# Patient Record
Sex: Female | Born: 1935 | Race: Black or African American | Hispanic: No | Marital: Single | Smoking: Current every day smoker
Health system: Southern US, Community
[De-identification: ages and names within clinical notes are randomized; demographics above are authoritative.]

## PROBLEM LIST (undated history)

## (undated) DIAGNOSIS — E119 Type 2 diabetes mellitus without complications: Secondary | ICD-10-CM

## (undated) DIAGNOSIS — F0390 Unspecified dementia without behavioral disturbance: Secondary | ICD-10-CM

## (undated) DIAGNOSIS — I10 Essential (primary) hypertension: Secondary | ICD-10-CM

## (undated) DIAGNOSIS — M1990 Unspecified osteoarthritis, unspecified site: Secondary | ICD-10-CM

## (undated) HISTORY — PX: CARDIAC SURGERY: SHX584

## (undated) HISTORY — PX: ABDOMINAL HYSTERECTOMY: SHX81

---

## 2017-07-05 ENCOUNTER — Emergency Department (HOSPITAL_COMMUNITY): Payer: Medicare Other

## 2017-07-05 ENCOUNTER — Encounter (HOSPITAL_COMMUNITY): Payer: Self-pay | Admitting: Emergency Medicine

## 2017-07-05 ENCOUNTER — Observation Stay (HOSPITAL_COMMUNITY)
Admission: EM | Admit: 2017-07-05 | Discharge: 2017-07-08 | Disposition: A | Discharge status: Home / Self Care | Payer: Medicare Other | Attending: Internal Medicine | Admitting: Internal Medicine

## 2017-07-05 DIAGNOSIS — Z90710 Acquired absence of both cervix and uterus: Secondary | ICD-10-CM | POA: Diagnosis not present

## 2017-07-05 DIAGNOSIS — I447 Left bundle-branch block, unspecified: Secondary | ICD-10-CM | POA: Diagnosis not present

## 2017-07-05 DIAGNOSIS — Z7982 Long term (current) use of aspirin: Secondary | ICD-10-CM | POA: Diagnosis not present

## 2017-07-05 DIAGNOSIS — Z955 Presence of coronary angioplasty implant and graft: Secondary | ICD-10-CM | POA: Insufficient documentation

## 2017-07-05 DIAGNOSIS — F17210 Nicotine dependence, cigarettes, uncomplicated: Secondary | ICD-10-CM | POA: Diagnosis not present

## 2017-07-05 DIAGNOSIS — E785 Hyperlipidemia, unspecified: Secondary | ICD-10-CM | POA: Diagnosis not present

## 2017-07-05 DIAGNOSIS — F0390 Unspecified dementia without behavioral disturbance: Secondary | ICD-10-CM | POA: Diagnosis not present

## 2017-07-05 DIAGNOSIS — N179 Acute kidney failure, unspecified: Secondary | ICD-10-CM | POA: Diagnosis not present

## 2017-07-05 DIAGNOSIS — R197 Diarrhea, unspecified: Secondary | ICD-10-CM | POA: Diagnosis not present

## 2017-07-05 DIAGNOSIS — W010XXA Fall on same level from slipping, tripping and stumbling without subsequent striking against object, initial encounter: Secondary | ICD-10-CM | POA: Insufficient documentation

## 2017-07-05 DIAGNOSIS — Z79899 Other long term (current) drug therapy: Secondary | ICD-10-CM | POA: Insufficient documentation

## 2017-07-05 DIAGNOSIS — I129 Hypertensive chronic kidney disease with stage 1 through stage 4 chronic kidney disease, or unspecified chronic kidney disease: Secondary | ICD-10-CM | POA: Insufficient documentation

## 2017-07-05 DIAGNOSIS — N183 Chronic kidney disease, stage 3 (moderate): Secondary | ICD-10-CM | POA: Diagnosis not present

## 2017-07-05 DIAGNOSIS — I10 Essential (primary) hypertension: Secondary | ICD-10-CM | POA: Diagnosis present

## 2017-07-05 DIAGNOSIS — E119 Type 2 diabetes mellitus without complications: Secondary | ICD-10-CM

## 2017-07-05 DIAGNOSIS — E1122 Type 2 diabetes mellitus with diabetic chronic kidney disease: Secondary | ICD-10-CM | POA: Diagnosis not present

## 2017-07-05 DIAGNOSIS — R079 Chest pain, unspecified: Secondary | ICD-10-CM | POA: Diagnosis present

## 2017-07-05 DIAGNOSIS — R0789 Other chest pain: Secondary | ICD-10-CM | POA: Diagnosis present

## 2017-07-05 DIAGNOSIS — I2510 Atherosclerotic heart disease of native coronary artery without angina pectoris: Secondary | ICD-10-CM | POA: Insufficient documentation

## 2017-07-05 DIAGNOSIS — I700 Atherosclerosis of aorta: Secondary | ICD-10-CM | POA: Insufficient documentation

## 2017-07-05 HISTORY — DX: Unspecified dementia, unspecified severity, without behavioral disturbance, psychotic disturbance, mood disturbance, and anxiety: F03.90

## 2017-07-05 HISTORY — DX: Essential (primary) hypertension: I10

## 2017-07-05 HISTORY — DX: Unspecified osteoarthritis, unspecified site: M19.90

## 2017-07-05 HISTORY — DX: Type 2 diabetes mellitus without complications: E11.9

## 2017-07-05 LAB — BASIC METABOLIC PANEL
Anion gap: 8 (ref 5–15)
BUN: 23 mg/dL — ABNORMAL HIGH (ref 6–20)
CHLORIDE: 109 mmol/L (ref 101–111)
CO2: 28 mmol/L (ref 22–32)
CREATININE: 1.49 mg/dL — AB (ref 0.44–1.00)
Calcium: 8.9 mg/dL (ref 8.9–10.3)
GFR calc Af Amer: 36 mL/min — ABNORMAL LOW (ref >60)
GFR calc non Af Amer: 31 mL/min — ABNORMAL LOW (ref >60)
Glucose, Bld: 108 mg/dL — ABNORMAL HIGH (ref 65–99)
POTASSIUM: 4.3 mmol/L (ref 3.5–5.1)
SODIUM: 145 mmol/L (ref 135–145)

## 2017-07-05 LAB — CBC
HEMATOCRIT: 38.2 % (ref 36.0–46.0)
Hemoglobin: 12 g/dL (ref 12.0–15.0)
MCH: 29.1 pg (ref 26.0–34.0)
MCHC: 31.4 g/dL (ref 30.0–36.0)
MCV: 92.7 fL (ref 78.0–100.0)
Platelets: 267 10*3/uL (ref 150–400)
RBC: 4.12 MIL/uL (ref 3.87–5.11)
RDW: 15.4 % (ref 11.5–15.5)
WBC: 8.8 10*3/uL (ref 4.0–10.5)

## 2017-07-05 LAB — I-STAT TROPONIN, ED: Troponin i, poc: 0.02 ng/mL (ref 0.00–0.08)

## 2017-07-05 MED ORDER — ASPIRIN 325 MG PO TABS
325.0000 mg | ORAL_TABLET | Freq: Once | ORAL | Status: AC
  Administered 2017-07-05: 325 mg via ORAL
  Filled 2017-07-05: qty 1

## 2017-07-05 NOTE — ED Notes (Signed)
Pt reports wanting to go home and not having any chest pains, granddaughter states that pt was recently hospitalized 3 weeks ago for identical symptoms.

## 2017-07-05 NOTE — ED Triage Notes (Signed)
Pt reports falling yesterday. Pt also c/o left sided chest pains that radiates to left arm that started today intermittently.  Pt has dementia.

## 2017-07-05 NOTE — ED Notes (Signed)
ED Provider at bedside. 

## 2017-07-05 NOTE — ED Provider Notes (Signed)
[facility]-EMERGENCY DEPT Provider Note   CSN: [id number] Arrival date & time: 07/05/17  1726     History   Chief Complaint Chief Complaint  Patient presents with  . Chest Pain  . Fall    HPI Joann Eliot FordBriggs is a 84 y.o. female.  HPI Patient presents to the emergency department with mid chest pain that radiated down the left arm intermittently today.  The patient does have a cardiac history.  Patient does have dementia and can give some history but the granddaughter states that is not totally accurate.  Patient did not take any medications prior to arrival.  Not able to give timing of the chest discomfort.  Patient states she did have some shortness of breath as well.  The patient denies headache,blurred vision, neck pain, fever, cough, weakness, numbness, dizziness, anorexia, edema, abdominal pain, nausea, vomiting, diarrhea, rash, back pain, dysuria, hematemesis, bloody stool, near syncope, or syncope. Past Medical History:  Diagnosis Date  . Arthritis   . Dementia   . Diabetes mellitus without complication (HCC)   . Hypertension     There are no active problems to display for this patient.   Past Surgical History:  Procedure Laterality Date  . ABDOMINAL HYSTERECTOMY    . CARDIAC SURGERY       OB History   None      Home Medications    Prior to Admission medications   Medication Sig Start Date End Date Taking? Authorizing Provider  aspirin EC 81 MG tablet Take 81 mg by mouth daily.   Yes [provider]  lisinopril-hydrochlorothiazide (PRINZIDE,ZESTORETIC) 10-12.5 MG tablet Take 1 tablet by mouth daily.   Yes [provider]  MELATONIN PO Take 1 tablet by mouth at bedtime as needed (sleep).   Yes [provider]  rivastigmine (EXELON) 4.6 mg/24hr Place 4.6 mg onto the skin daily.    Yes [provider]  simvastatin (ZOCOR) 40 MG tablet Take 40 mg by mouth daily.   Yes [provider]    Family  History No family history on file.  Social History Social History   Tobacco Use  . Smoking status: Current Every Day Smoker    Types: Cigarettes  . Smokeless tobacco: Never Used  Substance Use Topics  . Alcohol use: Not Currently  . Drug use: Not on file     Allergies   Patient has no known allergies.   Review of Systems Review of Systems All other systems negative except as documented in the HPI. All pertinent positives and negatives as reviewed in the HPI.  Physical Exam Updated Vital Signs BP (!) 166/91   Pulse 70   Temp 98.3 F (36.8 C) (Oral)   Resp 13   Ht 5\' 7"  (1.702 m)   Wt 72.6 kg (160 lb)   SpO2 100%   BMI 25.06 kg/m   Physical Exam  Constitutional: She is oriented to person, place, and time. She appears well-developed and well-nourished. No distress.  HENT:  Head: Normocephalic and atraumatic.  Mouth/Throat: Oropharynx is clear and moist.  Eyes: Pupils are equal, round, and reactive to light.  Neck: Normal range of motion. Neck supple.  Cardiovascular: Normal rate, regular rhythm, normal heart sounds and normal pulses. Exam reveals no gallop and no friction rub.  No murmur heard. Pulmonary/Chest: Effort normal and breath sounds normal. No respiratory distress. She has no decreased breath sounds. She has no wheezes. She has no rhonchi. She has no rales.  Abdominal: Soft. Bowel  sounds are normal. She exhibits no distension. There is no tenderness.  Neurological: She is alert and oriented to person, place, and time. She exhibits normal muscle tone. Coordination normal.  Skin: Skin is warm and dry. Capillary refill takes less than 2 seconds. No rash noted. No erythema.  Psychiatric: She has a normal mood and affect. Her behavior is normal.  Nursing note and vitals reviewed.    ED Treatments / Results  Labs (all labs ordered are listed, but only abnormal results are displayed) Labs Reviewed  BASIC METABOLIC PANEL - Abnormal; Notable for the following  components:      Result Value   Glucose, Bld 108 (*)    BUN 23 (*)    Creatinine, Ser 1.49 (*)    GFR calc non Af Amer 31 (*)    GFR calc Af Amer 36 (*)    All other components within normal limits  CBC  I-STAT TROPONIN, ED    EKG EKG Interpretation  Date/Time:  Friday Billye 21 2019 17:42:22 EDT Ventricular Rate:  83 PR Interval:    QRS Duration: 109 QT Interval:  386 QTC Calculation: 454 R Axis:   -20 Text Interpretation:  Sinus rhythm Incomplete left bundle branch block LVH with secondary repolarization abnormality Anterior Q waves, possibly due to LVH no prior available for comparison Confirmed by Tilden Fossa [phone]) on 07/05/2017 8:10:13 PM   Radiology Dg Chest 2 View  Result Date: 07/05/2017 CLINICAL DATA:  Chest pain EXAM: CHEST - 2 VIEW COMPARISON:  None. FINDINGS: No acute opacity or pleural effusion. Linear scar atelectasis in the left lower lung. Heart size within normal limits. Tortuous aorta with atherosclerosis. No pneumothorax. IMPRESSION: No active cardiopulmonary disease. Aortic tortuosity. Small amount of linear atelectasis or scarring in the left lower lung. Electronically Signed   By: Jasmine Pang M.D.   On: 07/05/2017 18:13    Procedures Procedures (including critical care time)  Medications Ordered in ED Medications - No data to display   Initial Impression / Assessment and Plan / ED Course  I have reviewed the triage vital signs and the nursing notes.  Pertinent labs & imaging results that were available during my care of the patient were reviewed by me and considered in my medical decision making (see chart for details).     Patient does have an extensive cardiac history and will need admission for cardiac rule out.  The granddaughter felt more comfortable with this plan.  I will speak with the [facility] about admission.  Final Clinical Impressions(s) / ED Diagnoses   Final diagnoses:  None    ED Discharge Orders    None         Kyra Manges 07/05/17 2226    Tilden Fossa, MD 07/16/17 [phone]

## 2017-07-05 NOTE — ED Notes (Signed)
Pt's contact--- Patsy LagerYOLANDA (granddaughter): tel# [phone]

## 2017-07-06 ENCOUNTER — Other Ambulatory Visit: Payer: Self-pay

## 2017-07-06 ENCOUNTER — Other Ambulatory Visit (HOSPITAL_COMMUNITY): Payer: Medicare Other

## 2017-07-06 ENCOUNTER — Encounter (HOSPITAL_COMMUNITY): Payer: Self-pay

## 2017-07-06 DIAGNOSIS — E119 Type 2 diabetes mellitus without complications: Secondary | ICD-10-CM

## 2017-07-06 DIAGNOSIS — R0789 Other chest pain: Secondary | ICD-10-CM | POA: Diagnosis not present

## 2017-07-06 DIAGNOSIS — R079 Chest pain, unspecified: Secondary | ICD-10-CM | POA: Diagnosis not present

## 2017-07-06 DIAGNOSIS — F0390 Unspecified dementia without behavioral disturbance: Secondary | ICD-10-CM | POA: Diagnosis present

## 2017-07-06 DIAGNOSIS — I10 Essential (primary) hypertension: Secondary | ICD-10-CM | POA: Diagnosis present

## 2017-07-06 DIAGNOSIS — N183 Chronic kidney disease, stage 3 (moderate): Secondary | ICD-10-CM | POA: Diagnosis not present

## 2017-07-06 DIAGNOSIS — E1122 Type 2 diabetes mellitus with diabetic chronic kidney disease: Secondary | ICD-10-CM | POA: Diagnosis not present

## 2017-07-06 DIAGNOSIS — I129 Hypertensive chronic kidney disease with stage 1 through stage 4 chronic kidney disease, or unspecified chronic kidney disease: Secondary | ICD-10-CM | POA: Diagnosis not present

## 2017-07-06 LAB — TROPONIN I: Troponin I: <0.03 ng/mL (ref <0.03)

## 2017-07-06 MED ORDER — TRAZODONE HCL 50 MG PO TABS: 50.0000 mg | ORAL_TABLET | Freq: Every evening | ORAL | Status: DC | PRN

## 2017-07-06 MED ORDER — HEPARIN SODIUM (PORCINE) 5000 UNIT/ML IJ SOLN
5000.0000 [IU] | Freq: Three times a day (TID) | INTRAMUSCULAR | Status: DC
  Administered 2017-07-06 – 2017-07-08 (×6): 5000 [IU] via SUBCUTANEOUS
  Filled 2017-07-06 (×6): qty 1

## 2017-07-06 MED ORDER — ONDANSETRON HCL 4 MG/2ML IJ SOLN
4.0000 mg | Freq: Four times a day (QID) | INTRAMUSCULAR | Status: DC | PRN
Start: 2017-07-06 — End: 2017-07-08

## 2017-07-06 MED ORDER — ROSUVASTATIN CALCIUM 10 MG PO TABS: 10.0000 mg | ORAL_TABLET | Freq: Every day | ORAL | Status: DC

## 2017-07-06 MED ORDER — SIMVASTATIN 40 MG PO TABS
40.0000 mg | ORAL_TABLET | Freq: Every day | ORAL | Status: DC
  Administered 2017-07-06: 40 mg via ORAL
  Filled 2017-07-06: qty 1

## 2017-07-06 MED ORDER — ACETAMINOPHEN 650 MG RE SUPP: 650.0000 mg | Freq: Four times a day (QID) | RECTAL | Status: DC | PRN

## 2017-07-06 MED ORDER — AMLODIPINE BESYLATE 5 MG PO TABS
5.0000 mg | ORAL_TABLET | Freq: Every day | ORAL | Status: DC
  Administered 2017-07-06 – 2017-07-08 (×3): 5 mg via ORAL
  Filled 2017-07-06 (×3): qty 1

## 2017-07-06 MED ORDER — SODIUM CHLORIDE 0.45 % IV SOLN
INTRAVENOUS | Status: AC
  Administered 2017-07-06: 04:00:00 via INTRAVENOUS

## 2017-07-06 MED ORDER — ROSUVASTATIN CALCIUM 10 MG PO TABS
10.0000 mg | ORAL_TABLET | Freq: Every day | ORAL | Status: DC
Start: 2017-07-06 — End: 2017-07-06

## 2017-07-06 MED ORDER — ACETAMINOPHEN 325 MG PO TABS: 650.0000 mg | ORAL_TABLET | Freq: Four times a day (QID) | ORAL | Status: DC | PRN

## 2017-07-06 MED ORDER — NITROGLYCERIN 0.4 MG SL SUBL
0.4000 mg | SUBLINGUAL_TABLET | SUBLINGUAL | Status: DC | PRN
Start: 2017-07-06 — End: 2017-07-08

## 2017-07-06 MED ORDER — ONDANSETRON HCL 4 MG PO TABS: 4.0000 mg | ORAL_TABLET | Freq: Four times a day (QID) | ORAL | Status: DC | PRN

## 2017-07-06 MED ORDER — ASPIRIN EC 81 MG PO TBEC
81.0000 mg | DELAYED_RELEASE_TABLET | Freq: Every day | ORAL | Status: DC
  Administered 2017-07-06 – 2017-07-08 (×3): 81 mg via ORAL
  Filled 2017-07-06 (×3): qty 1

## 2017-07-06 MED ORDER — RIVASTIGMINE 4.6 MG/24HR TD PT24
4.6000 mg | MEDICATED_PATCH | Freq: Every day | TRANSDERMAL | Status: DC
  Administered 2017-07-06 – 2017-07-07 (×2): 4.6 mg via TRANSDERMAL
  Filled 2017-07-06 (×3): qty 1

## 2017-07-06 NOTE — Evaluation (Signed)
Physical Therapy Evaluation Patient Details Name: Joann Mcgee MRN: [id number] DOB: 1930/01/23 Today's Date: 07/06/2017   History of Present Illness  Pt admitted s/p fall from bed and with c/o chest pain.  Pt with hx of DM, dementia and "extensive cardiac history"  Clinical Impression  Pt admitted as above and presenting with functional mobility limitations 2* generalized weakness and mild ambulatory balance deficits.  Pt should progress to dc home with assist of family.    Follow Up Recommendations No PT follow up    Equipment Recommendations  None recommended by PT    Recommendations for Other Services       Precautions / Restrictions Precautions Precautions: Fall Restrictions Weight Bearing Restrictions: No      Mobility  Bed Mobility               General bed mobility comments: Pt up in chair and requests back to same  Transfers Overall transfer level: Modified independent Equipment used: 4-wheeled walker                Ambulation/Gait Ambulation/Gait assistance: Min Emergency planning/management officerguard;Supervision Gait Distance (Feet): 400 Feet Assistive device: 4-wheeled walker Gait Pattern/deviations: Step-through pattern;Shuffle;Trunk flexed Gait velocity: decr   General Gait Details: mild instability but no LOB; cues for posture and position from [employer]  Stairs            Wheelchair Mobility    Modified Rankin (Stroke Patients Only)       Balance Overall balance assessment: Needs assistance Sitting-balance support: No upper extremity supported;Feet supported Sitting balance-Leahy Scale: Good     Standing balance support: No upper extremity supported Standing balance-Leahy Scale: Good                               Pertinent Vitals/Pain Pain Assessment: No/denies pain    Home Living Family/patient expects to be discharged to:: Private residence Living Arrangements: Other relatives Available Help at Discharge: Family;Available 24 hours/day Type  of Home: House       Home Layout: One level Home Equipment: Walker - 4 wheels Additional Comments: Pt lives with granddaughter    Prior Function Level of Independence: Independent with assistive device(s)         Comments: Pt uses rollator     Hand Dominance        Extremity/Trunk Assessment   Upper Extremity Assessment Upper Extremity Assessment: Generalized weakness    Lower Extremity Assessment Lower Extremity Assessment: Generalized weakness    Cervical / Trunk Assessment Cervical / Trunk Assessment: Kyphotic  Communication   Communication: No difficulties  Cognition Arousal/Alertness: Awake/alert Behavior During Therapy: WFL for tasks assessed/performed;Impulsive Overall Cognitive Status: History of cognitive impairments - at baseline                                 General Comments: pt with hx of dementia      General Comments      Exercises     Assessment/Plan    PT Assessment Patient needs continued PT services  PT Problem List Decreased activity tolerance;Decreased balance;Decreased mobility;Decreased knowledge of use of DME;Decreased strength       PT Treatment Interventions DME instruction;Gait training;Functional mobility training;Therapeutic activities;Therapeutic exercise;Balance training;Patient/family education    PT Goals (Current goals can be found in the Care Plan section)  Acute Rehab PT Goals Patient Stated Goal: HOME PT Goal Formulation: With patient Time  For Goal Achievement: 07/20/17 Potential to Achieve Goals: Good    Frequency Min 3X/week   Barriers to discharge        Co-evaluation               AM-PAC PT "6 Clicks" Daily Activity  Outcome Measure Difficulty turning over in bed (including adjusting bedclothes, sheets and blankets)?: A Little Difficulty moving from lying on back to sitting on the side of the bed? : A Little Difficulty sitting down on and standing up from a chair with arms (e.g.,  wheelchair, bedside commode, etc,.)?: A Little Help needed moving to and from a bed to chair (including a wheelchair)?: A Little Help needed walking in hospital room?: A Little Help needed climbing 3-5 steps with a railing? : A Little 6 Click Score: 18    End of Session Equipment Utilized During Treatment: Gait belt Activity Tolerance: Patient tolerated treatment well Patient left: in chair;with call bell/phone within reach;with chair alarm set Nurse Communication: Mobility status PT Visit Diagnosis: Difficulty in walking, not elsewhere classified (R26.2)    Time: 1610-9604 PT Time Calculation (min) (ACUTE ONLY): 21 min   Charges:   PT Evaluation $PT Eval Low Complexity: 1 Low     PT G Codes:        Pg [phone]   Levora Werden 07/06/2017, 12:40 PM

## 2017-07-06 NOTE — Progress Notes (Signed)
84 year old female admitted early this morning chest pain atypical.  Patient has history of dementia, diabetes, hypertension.  History obtained from patient's granddaughter who patient lives with.  Patient had a fall 2 days ago which when she actually slipped down from her bed to the floor with no syncope no complaints of chest pain or shortness of breath at that time.  However she was admitted few weeks ago to the hospital with chest pain which I do not have the records.  I have reviewed her chart and her labs.  Cardiac enzymes are normal.  EKG is unremarkable.  Patient moved here from [place] last year.  Will order echocardiogram and consult cardiology per family request.  Patient might need a stress test prior to discharge.

## 2017-07-06 NOTE — H&P (Addendum)
History and Physical    Joann Mcgee ZOX:[id number] DOB: 06-03-1930 DOA: 07/05/2017  [facility]   Patient coming from: home  Chief Complaint: Chest Pain  HPI: Joann Mcgee is a 84 y.o. female with medical history significant for DM, HTN, dementia, presented to the ED with reports of chest pain.  Patient was brought in by her granddaughter-who patient resides with but is not present at bedside, got voicemail when I called her number.  Per chat-patient complained of intermittent, left-sided chest pain that radiates to her left arm, and similar occurrence 3 weeks ago requiring hospitalization.  Patient denies chest pain, or shortness of breath.  He cannot tell me any provoking or relieving factors. Reports a mild chronic cough ?  If productive.  Reports she fell 2 days ago, when she got out of bed, her legs slid out from underneath her, as the floor is slippery.  She has been ambulating since then, without pain.  She reports last time she had chest pain was 2 weeks ago.  Patient denies recent trip, denies swelling of her lower extremities, pain or redness.   ED Course: Blood pressure systolic 135- 164.  Heart rate 59 - 89.  Chest x-ray-no acute abnormality, small amount of linear atelectasis or scarring LLL.  WBC 8.8.  Hemoglobin stable 12.  Creatinine elevated 1.49.  I-stat troponin negative.  Hospitalist was called to admit for chest pain as she has a cardiac history.  Review of Systems: As per HPI otherwise 10 point review of systems negative.   Past Medical History:  Diagnosis Date  . Arthritis   . Dementia   . Diabetes mellitus without complication (HCC)   . Hypertension     Past Surgical History:  Procedure Laterality Date  . ABDOMINAL HYSTERECTOMY    . CARDIAC SURGERY       reports that she has been smoking cigarettes.  She has never used smokeless tobacco. She reports that she drank alcohol. Her drug history is not on file.  No Known  Allergies  Family history not contributory.  Prior to Admission medications   Medication Sig Start Date End Date Taking? Authorizing Provider  aspirin EC 81 MG tablet Take 81 mg by mouth daily.   Yes [provider]  lisinopril-hydrochlorothiazide (PRINZIDE,ZESTORETIC) 10-12.5 MG tablet Take 1 tablet by mouth daily.   Yes [provider]  MELATONIN PO Take 1 tablet by mouth at bedtime as needed (sleep).   Yes [provider]  rivastigmine (EXELON) 4.6 mg/24hr Place 4.6 mg onto the skin daily.    Yes [provider]  simvastatin (ZOCOR) 40 MG tablet Take 40 mg by mouth daily.   Yes [provider]    Physical Exam: Vitals:   07/05/17 2130 07/05/17 2200 07/05/17 2230 07/05/17 2331  BP: (!) 168/94 (!) 166/91 138/84 (!) 164/95  Pulse: 65 70 71 64  Resp: 17 13  17   Temp:      TempSrc:      SpO2: 100% 100% 100% 95%  Weight:      Height:        Constitutional: NAD, calm, comfortable Vitals:   07/05/17 2130 07/05/17 2200 07/05/17 2230 07/05/17 2331  BP: (!) 168/94 (!) 166/91 138/84 (!) 164/95  Pulse: 65 70 71 64  Resp: 17 13  17   Temp:      TempSrc:      SpO2: 100% 100% 100% 95%  Weight:      Height:  Eyes: PERRL, lids and conjunctivae normal ENMT: Mucous membranes are dry. Posterior pharynx clear of any exudate or lesions Neck: normal, supple, no masses, no thyromegaly Respiratory: clear to auscultation bilaterally, no wheezing, no crackles. Normal respiratory effort. No accessory muscle use.  Cardiovascular: Regular rate and rhythm, no murmurs / rubs / gallops. No extremity edema. 2+ pedal pulses. Abdomen: no tenderness, no masses palpated. No hepatosplenomegaly. Bowel sounds positive.  Musculoskeletal: no clubbing / cyanosis. No joint deformity upper and lower extremities. Good ROM, no contractures. Normal muscle tone.  Skin: no rashes, lesions, ulcers. No induration Neurologic: CN 2-12 grossly intact.  Strength 5/5 in all 4.    Psychiatric: Normal judgment and insight. Alert and oriented x 2- Person and place. Normal mood.   Labs on Admission: I have personally reviewed following labs and imaging studies  CBC: Recent Labs  Lab 07/05/17 1752  WBC 8.8  HGB 12.0  HCT 38.2  MCV 92.7  PLT 267   Basic Metabolic Panel: Recent Labs  Lab 07/05/17 1752  NA 145  K 4.3  CL 109  CO2 28  GLUCOSE 108*  BUN 23*  CREATININE 1.49*  CALCIUM 8.9    Radiological Exams on Admission: Dg Chest 2 View  Result Date: 07/05/2017 CLINICAL DATA:  Chest pain EXAM: CHEST - 2 VIEW COMPARISON:  None. FINDINGS: No acute opacity or pleural effusion. Linear scar atelectasis in the left lower lung. Heart size within normal limits. Tortuous aorta with atherosclerosis. No pneumothorax. IMPRESSION: No active cardiopulmonary disease. Aortic tortuosity. Small amount of linear atelectasis or scarring in the left lower lung. Electronically Signed   By: Jasmine PangKim  Fujinaga M.D.   On: 07/05/2017 18:13    EKG: Independently reviewed.  Incomplete LBBB, T wave inversions 1 aVL.  No old EKG to compare.  Assessment/Plan Principal Problem:   Chest pain Active Problems:   DM (diabetes mellitus) (HCC)   HTN (hypertension)   Dementia   Chest pain-with reported SOB. Patient denies, but has Dementia hx. On room air. I-STAT troponin negative.  EKG- incomplete LBBB. No old EKG to compare. Per care everywhere- Hx of CAD with stent placement. [facility] [place] for chest pain, d-dimer was elevated at 0.9, otherwise work-up troponin and chest x-ray was unremarkable.  Patient declined CTA.  Patient was to follow-up with her cardiologist. At this time patient's presentation is not suspicious for thromboembolic dx. -Trend troponins x3 -Echocardiogram - Aspirin 325mg  x1, cont 81mg  daily, cont home statin - EKG a.m - May benefit from stress testing considering recurrent chest pain, and cardiac hx.  Mild AKI- Cr- 1.4. Last check, per  care everywhere- 1.1 (05/2017) - Hold Lisinopril-HCTz - Hydrate Half n/s 100cc/hr x 12 hrs  DM-not on hypoglycemic agents.  Glucose 108.   HTN- systolic 135- 166. -Hold home lisinopril 10-12.5 daily   DVT prophylaxis: Heparin Code Status: Assume full Family Communication: Unsuccessfully tried to contact grand daughter Disposition Plan: 1-2 days Consults called: None Admission status: Obs, tele   Onnie BoerEjiroghene E Denea Cheaney MD [facility] Pager [phone] From 6PM-2AM.  Otherwise please contact night-coverage [email or web address] Password [email or web address]  07/06/2017, 1:01 AM

## 2017-07-06 NOTE — Plan of Care (Signed)
Patient stable 7 a to 7 p, VSS.  Patient denies any chest pain.  Patient very forgetful, jumps up without calling for staff.  Spoke to granddaughter on phone several times this shift.

## 2017-07-06 NOTE — ED Notes (Signed)
ED TO INPATIENT HANDOFF REPORT  Name/Age/Gender Joann Mcgee 84 y.o. female  Code Status   Home/SNF/Other Home  Chief Complaint fall; chest pain  Level of Care/Admitting Diagnosis ED Disposition    ED Disposition Condition [facility] Area: [place] [[id number]]  Level of Care: Telemetry [5]  Admit to tele based on following criteria: Other see comments  Comments: Chest pain  Diagnosis: Chest pain [409735]  Admitting Physician: Bethena Roys [[id number]]  Attending Physician: Bethena Roys [phone]  PT Class (Do Not Modify): Observation [104]  PT Acc Code (Do Not Modify): Observation [10022]       Medical History Past Medical History:  Diagnosis Date  . Arthritis   . Dementia   . Diabetes mellitus without complication ([place])   . Hypertension     Allergies No Known Allergies  IV Location/Drains/Wounds Patient Lines/Drains/Airways Status   Active Line/Drains/Airways    Name:   Placement date:   Placement time:   Site:   Days:   Peripheral IV 07/05/17 Right;Posterior Forearm   07/05/17    2241    Forearm   1          Labs/Imaging Results for orders placed or performed during the hospital encounter of 07/05/17 (from the past 48 hour(s))  Basic metabolic panel     Status: Abnormal   Collection Time: 07/05/17  5:52 PM  Result Value Ref Range   Sodium 145 135 - 145 mmol/L   Potassium 4.3 3.5 - 5.1 mmol/L   Chloride 109 101 - 111 mmol/L   CO2 28 22 - 32 mmol/L   Glucose, Bld 108 (H) 65 - 99 mg/dL   BUN 23 (H) 6 - 20 mg/dL   Creatinine, Ser 1.49 (H) 0.44 - 1.00 mg/dL   Calcium 8.9 8.9 - 10.3 mg/dL   GFR calc non Af Amer 31 (L) >60 mL/min   GFR calc Af Amer 36 (L) >60 mL/min    Comment: (NOTE) The eGFR has been calculated using the CKD EPI equation. This calculation has not been validated in all clinical situations. eGFR's persistently <60 mL/min signify possible Chronic Kidney Disease.    Anion gap 8 5 - 15   Comment: Performed at [facility], [place] [address] [place], [place] [address]  CBC     Status: None   Collection Time: 07/05/17  5:52 PM  Result Value Ref Range   WBC 8.8 4.0 - 10.5 K/uL   RBC 4.12 3.87 - 5.11 MIL/uL   Hemoglobin 12.0 12.0 - 15.0 g/dL   HCT 38.2 36.0 - 46.0 %   MCV 92.7 78.0 - 100.0 fL   MCH 29.1 26.0 - 34.0 pg   MCHC 31.4 30.0 - 36.0 g/dL   RDW 15.4 11.5 - 15.5 %   Platelets 267 150 - 400 K/uL    Comment: Performed at [facility], [place] [address] [place], [place] [address]  I-stat troponin, ED     Status: None   Collection Time: 07/05/17  5:55 PM  Result Value Ref Range   Troponin i, poc 0.02 0.00 - 0.08 ng/mL   Comment 3            Comment: Due to the release kinetics of cTnI, a negative result within the first hours of the onset of symptoms does not rule out myocardial infarction with certainty. If myocardial infarction is still suspected, repeat the test at appropriate intervals.    Dg Chest 2  View  Result Date: 07/05/2017 CLINICAL DATA:  Chest pain EXAM: CHEST - 2 VIEW COMPARISON:  None. FINDINGS: No acute opacity or pleural effusion. Linear scar atelectasis in the left lower lung. Heart size within normal limits. Tortuous aorta with atherosclerosis. No pneumothorax. IMPRESSION: No active cardiopulmonary disease. Aortic tortuosity. Small amount of linear atelectasis or scarring in the left lower lung. Electronically Signed   By: Donavan Foil M.D.   On: 07/05/2017 18:13    Pending Labs Unresulted Labs (From admission, onward)   Start     Ordered   07/06/17 0700  Troponin I  Now then every 6 hours,   R     07/06/17 0100   07/06/17 0145  Troponin I  Once,   STAT     07/06/17 0145      Vitals/Pain Today's Vitals   07/05/17 2331 07/06/17 0000 07/06/17 0045 07/06/17 0109  BP: (!) 164/95 137/74  (!) 153/100  Pulse: 64 (!) 59 70 61  Resp: 17  14 (!) 22  Temp:      TempSrc:      SpO2: 95% 93% 97% 100%  Weight:       Height:      PainSc:        Isolation Precautions No active isolations  Medications Medications  aspirin tablet 325 mg (325 mg Oral Given 07/05/17 2307)    Mobility walks

## 2017-07-06 NOTE — Consult Note (Addendum)
CARDIOLOGY CONSULT NOTE  Patient ID: Joann Mcgee MRN: [id number] DOB/AGE: 1930-08-01 84 y.o.  Admit date: 07/05/2017 Referring Physician  Blanchard Mane, MD Primary Physician:  [facility], [facility] Reason for Consultation  Chest pain  HPI: Joann Mcgee  is a 84 y.o. female  With Patient with hypertension, diabetes mellitus with stage III chronic kidney disease, hyperlipidemia, tobacco use disorder, dementia who was last seen in outside hospital in May 2018 with atypical chest pain.  At that time echocardiogram had revealed normal LV systolic function and stage III chronic kidney disease she was ruled out for myocardial infarction and discharged home.  She is now readmitted to the hospital with her granddaughter stating that she has been having chest discomfort.  Patient was also admitted in September 2018 with altered mental status and was found to have mild to moderate stenosis of bilateral internal carotid artery but no stroke.  Echocardiogram revealed normal LVEF.  Bubble study did not reveal any intracardiac shunting.  However MRA and MRI of the head did not reveal any significant carotid stenosis and mild atherosclerotic changes were noted.  Patient also has 5.7 cm left thyroid mass needing further evaluation and has been worked up for cancer and negative while she was in [place] last fall in 2018.  Patient herself denies any symptoms presently and states that she has no chest pain or shortness of breath.  She has occasional diarrhea.  Past Medical History:  Diagnosis Date  . Arthritis   . Dementia   . Diabetes mellitus without complication (HCC)   . Hypertension      Past Surgical History:  Procedure Laterality Date  . ABDOMINAL HYSTERECTOMY    . CARDIAC SURGERY       History reviewed. No pertinent family history.   Social History: Social History   Socioeconomic History  . Marital status: Single    Spouse name: Not on file  . Number of children:  Not on file  . Years of education: Not on file  . Highest education level: Not on file  Occupational History  . Not on file  Social Needs  . Financial resource strain: Not on file  . Food insecurity:    Worry: Not on file    Inability: Not on file  . Transportation needs:    Medical: Not on file    Non-medical: Not on file  Tobacco Use  . Smoking status: Current Every Day Smoker    Types: Cigarettes  . Smokeless tobacco: Never Used  Substance and Sexual Activity  . Alcohol use: Not Currently  . Drug use: Not on file  . Sexual activity: Not on file  Lifestyle  . Physical activity:    Days per week: Not on file    Minutes per session: Not on file  . Stress: Not on file  Relationships  . Social connections:    Talks on phone: Not on file    Gets together: Not on file    Attends religious service: Not on file    Active member of club or organization: Not on file    Attends meetings of clubs or organizations: Not on file    Relationship status: Not on file  . Intimate partner violence:    Fear of current or ex partner: Not on file    Emotionally abused: Not on file    Physically abused: Not on file    Forced sexual activity: Not on file  Other Topics Concern  . Not on  file  Social History Narrative  . Not on file     Medications Prior to Admission  Medication Sig Dispense Refill Last Dose  . aspirin EC 81 MG tablet Take 81 mg by mouth daily.   Past Week at Unknown time  . lisinopril-hydrochlorothiazide (PRINZIDE,ZESTORETIC) 10-12.5 MG tablet Take 1 tablet by mouth daily.   Past Week at Unknown time  . MELATONIN PO Take 1 tablet by mouth at bedtime as needed (sleep).   Past Week at Unknown time  . rivastigmine (EXELON) 4.6 mg/24hr Place 4.6 mg onto the skin daily.    Past Week at Unknown time  . simvastatin (ZOCOR) 40 MG tablet Take 40 mg by mouth daily.   Past Week at Unknown time   Review of Systems  Constitutional: Negative.   HENT: Negative.   Eyes: Negative.    Respiratory: Negative.   Cardiovascular: Negative.   Gastrointestinal: Positive for diarrhea.  Genitourinary: Negative.   Musculoskeletal: Negative.   Skin: Negative.   Neurological: Negative for seizures, loss of consciousness and weakness.  Psychiatric/Behavioral: Negative.   Review of systems is arbitrary in view of dementia.   Physical Exam: Blood pressure (!) 159/91, pulse (!) 56, temperature 97.8 F (36.6 C), temperature source Oral, resp. rate 18, height 5\' 7"  (1.702 m), weight 72.2 kg (159 lb 2.8 oz), SpO2 96 %.  Physical Exam  Constitutional: She is oriented to person, place, and time. She appears well-developed and well-nourished. No distress.  HENT:  Head: Atraumatic.  Eyes: Conjunctivae are normal.  Neck: Normal range of motion. Neck supple.  Cardiovascular: Normal rate, regular rhythm, normal heart sounds and intact distal pulses. Exam reveals no gallop and no friction rub.  No murmur heard. Forcible PMI  Pulmonary/Chest: Effort normal and breath sounds normal.  Abdominal: Soft. Bowel sounds are normal.  Musculoskeletal: Normal range of motion. She exhibits no edema or tenderness.  Neurological: She is alert and oriented to person, place, and time.  Skin: Skin is warm and dry. She is not diaphoretic.  Psychiatric: She has a normal mood and affect.   Labs:   Lab Results  Component Value Date   WBC 8.8 07/05/2017   HGB 12.0 07/05/2017   HCT 38.2 07/05/2017   MCV 92.7 07/05/2017   PLT 267 07/05/2017    Recent Labs  Lab 07/05/17 1752  NA 145  K 4.3  CL 109  CO2 28  BUN 23*  CREATININE 1.49*  CALCIUM 8.9  GLUCOSE 108*   Cardiac Panel (last 3 results) Recent Labs    07/06/17 0457 07/06/17 0723 07/06/17 1240  TROPONINI <0.03 <0.03 <0.03   TSH No results for input(s): TSH in the last 8760 hours.  Radiology: Dg Chest 2 View  Result Date: 07/05/2017 CLINICAL DATA:  Chest pain EXAM: CHEST - 2 VIEW COMPARISON:  None. FINDINGS: No acute opacity or  pleural effusion. Linear scar atelectasis in the left lower lung. Heart size within normal limits. Tortuous aorta with atherosclerosis. No pneumothorax. IMPRESSION: No active cardiopulmonary disease. Aortic tortuosity. Small amount of linear atelectasis or scarring in the left lower lung. Electronically Signed   By: Jasmine Pang M.D.   On: 07/05/2017 18:13    Scheduled Meds: . aspirin EC  81 mg Oral Daily  . heparin  5,000 Units Subcutaneous Q8H  . rivastigmine  4.6 mg Transdermal Daily  . simvastatin  40 mg Oral Daily   Continuous Infusions: PRN Meds:.acetaminophen **OR** acetaminophen, ondansetron **OR** ondansetron (ZOFRAN) IV, traZODone  CARDIAC STUDIES: EKG: 07/05/2017: Normal  sinus rhythm/sinus bradycardia at rate of 54 bpm, left axis deviation, left anterior fascicular block.  LVH T wave inversion, cannot exclude anteroseptal infarct ischemia.  Normal QT interval.  These changes are also evident in prior hospital admission in May 2018 and also in September 2018.  T wave nausea new since yesterday.  Echo: Pending  Sept 2018:  Echocardiogram revealed normal LVEF.  Bubble study did not reveal any intracardiac shunting.    ASSESSMENT AND PLAN:  1.  Dementia, patient denies any chest pain or shortness of breath. 2.  Chest pain, as per her granddaughter, she had complained of chest pain, [facility] a month ago and what appears to be 6 months ago.  However no change in the EKG on exam, she has had prior echocardiogram revealing mild LVH and normal LVEF without wall motion abnormality.  Cardiac markers are negative for myocardial injury. 3.  Diabetes mellitus and hypertension with stage III chronic kidney disease.  Patient off of diabetic medications as per granddaughter who states that she has been managing her medications and as she is not giving her wrong foods, her blood pressure and diabetes has been well controlled without any medications  and she has been giving lisinopril every other day. 4.  Hypertension with hypertensive heart disease, blood pressure is uncontrolled here today.   Recommendation: I discussed with the patient's granddaughter who is the power of attorney for health over the telephone.  Advised her that in view of patient not having any active chest pain, severe dementia, would recommend conservative therapy especially in view of stage IIIb chronic kidney disease.  I have advised her and discussed with her extensively regarding presentation of ACS and angina pectoris.  Patient's granddaughter has been bringing her to the ED with the symptoms of any chest pain that patient complains of even if it is minimal.  Blood pressure is not well controlled, patient's daughter has been giving lisinopril every other day stating that with good food her blood pressure has been well controlled.  We will start her on amlodipine 5 mg daily, switch her from simvastatin to Crestor 10 mg.  Patient's granddaughter has stopped giving her simvastatin and also her dementia patch stating that 1 of them is causing diarrhea.  From cardiac standpoint I would not recommend any further evaluation.  After long discussion over the telephone with the granddaughter, she understands the signs and symptoms of myocardial infarction and ACS and need for continued medical therapy.  I have discussed with the primary team. She has a thyroid mass, has had thyroid surgery in the past, in the fall 2018, she has been worked up and there is no evidence of thyroid cancer.  Yates DecampJay Lititia Sen, MD 07/06/2017, 3:05 PM [facility] Pager: [phone] Office: [phone] If no answer Cell [phone]

## 2017-07-07 DIAGNOSIS — R079 Chest pain, unspecified: Secondary | ICD-10-CM | POA: Diagnosis not present

## 2017-07-07 DIAGNOSIS — R0789 Other chest pain: Secondary | ICD-10-CM | POA: Diagnosis not present

## 2017-07-07 LAB — BASIC METABOLIC PANEL
Anion gap: 8 (ref 5–15)
BUN: 14 mg/dL (ref 6–20)
CHLORIDE: 111 mmol/L (ref 101–111)
CO2: 26 mmol/L (ref 22–32)
Calcium: 8.2 mg/dL — ABNORMAL LOW (ref 8.9–10.3)
Creatinine, Ser: 0.9 mg/dL (ref 0.44–1.00)
GFR calc Af Amer: >60 mL/min (ref >60)
GFR calc non Af Amer: 57 mL/min — ABNORMAL LOW (ref >60)
Glucose, Bld: 92 mg/dL (ref 65–99)
Potassium: 3.8 mmol/L (ref 3.5–5.1)
Sodium: 145 mmol/L (ref 135–145)

## 2017-07-07 LAB — HEMOGLOBIN A1C
HEMOGLOBIN A1C: 5.7 % — AB (ref 4.8–5.6)
MEAN PLASMA GLUCOSE: 116.89 mg/dL

## 2017-07-07 LAB — GLUCOSE, CAPILLARY
GLUCOSE-CAPILLARY: 83 mg/dL (ref 65–99)
GLUCOSE-CAPILLARY: 136 mg/dL — AB (ref 65–99)

## 2017-07-07 MED ORDER — AMLODIPINE BESYLATE 5 MG PO TABS: 5.0000 mg | ORAL_TABLET | Freq: Every day | ORAL | 0 refills | Status: AC

## 2017-07-07 MED ORDER — LOPERAMIDE HCL 2 MG PO CAPS
4.0000 mg | ORAL_CAPSULE | Freq: Once | ORAL | Status: AC
  Administered 2017-07-07: 4 mg via ORAL
  Filled 2017-07-07: qty 2

## 2017-07-07 MED ORDER — ROSUVASTATIN CALCIUM 10 MG PO TABS: 10.0000 mg | ORAL_TABLET | Freq: Every day | ORAL | 0 refills | Status: DC

## 2017-07-07 MED ORDER — NITROGLYCERIN 0.4 MG SL SUBL: 0.4000 mg | SUBLINGUAL_TABLET | SUBLINGUAL | 0 refills | Status: AC | PRN

## 2017-07-07 MED ORDER — INSULIN ASPART 100 UNIT/ML ~~LOC~~ SOLN
0.0000 [IU] | Freq: Three times a day (TID) | SUBCUTANEOUS | Status: DC
  Administered 2017-07-08: 1 [IU] via SUBCUTANEOUS

## 2017-07-07 NOTE — Progress Notes (Signed)
Contacted patients granddaughter to let her know that patient was discharged.  Granddaughter very upset, says cardiologist told her yesterday patient would have a 2 D echo and she wanted that done before she left.  This RN read Dr. Verl DickerGanji's note that stated patient had a 2 D echo back in September and did not require any further cardiac workup.  I relayed this to granddaughter, again granddaughter very upset saying she needs to speak to Dr. Jacinto HalimGanji. I notified Dr. Jacinto HalimGanji of this and he contacted granddaughter.   Dr. Jacinto HalimGanji called this RN back stating granddaughter was not concerned about patients heart but about diarrhea that is caused by simvastatin or Exelon patch.  Patient did have one episode of loose stool yesterday and one today.  I reached out to granddaughter again who says patients "diarrhea" needs to be resolved before she picks her up from [facility] because she is pregnant and cannot clean it up.  Spoke with Dr. Jerolyn CenterMathews regarding above, one time dose of Imodium ordered for patient.

## 2017-07-07 NOTE — Progress Notes (Signed)
PROGRESS NOTE    Joann Mcgee  ZOX:[id number] DOB: [birth year] DOA: 07/05/2017 PCP: [facility]   Brief [place] y.o. female with medical history significant for DM, HTN, dementia, presented to the ED with reports of chest pain.  Patient was brought in by her granddaughter-who patient resides with but is not present at bedside, got voicemail when I called her number.  Per chat-patient complained of intermittent, left-sided chest pain that radiates to her left arm, and similar occurrence 3 weeks ago requiring hospitalization.  Patient denies chest pain, or shortness of breath.  He cannot tell me any provoking or relieving factors. Reports a mild chronic cough ?  If productive.  Reports she fell 2 days ago, when she got out of bed, her legs slid out from underneath her, as the floor is slippery.  She has been ambulating since then, without pain.  She reports last time she had chest pain was 2 weeks ago.  Patient denies recent trip, denies swelling of her lower extremities, pain or redness.   ED Course: Blood pressure systolic 135- 164.  Heart rate 59 - 89.  Chest x-ray-no acute abnormality, small amount of linear atelectasis or scarring LLL.  WBC 8.8.  Hemoglobin stable 12.  Creatinine elevated 1.49.  I-stat troponin negative.  Hospitalist was called to admit for chest pain as she has a cardiac history.     Assessment & Plan:   Principal Problem:   Chest pain Active Problems:   DM (diabetes mellitus) (HCC)   HTN (hypertension)   Dementia  1] chest pain patient ruled out for MI with negative cardiac enzymes no acute EKG changes.  Appreciate cardiology input.  Echocardiogram still pending.  2] hypertension patient started on Norvasc yesterday.  3] diabetes she is not on any medications at home or here.  SSI.  4] AKI patient was on hydrochlorothiazide and ACE inhibitor at home which is being on hold at this time improving.  5] dementia  Continue  Aricept.    DVT prophylaxis: Heparin Code Status: Full code Family Communication: Left message with the granddaughter that she will be discharged home tomorrow. Disposition Plan: home  Consultants:  Cardiology  Procedures: None Antimicrobials: None  Subjective:resting in bed in nad   Objective: Vitals:   07/06/17 0501 07/06/17 1517 07/06/17 2017 07/07/17 0542  BP: (!) 159/91 (!) 166/99 (!) 157/89 (!) 149/86  Pulse: (!) 56 67 72 62  Resp: 18 16 20 20   Temp: 97.8 F (36.6 C) 97.9 F (36.6 C) 98.1 F (36.7 C) 99 F (37.2 C)  TempSrc: Oral Oral Oral Oral  SpO2: 96% 100% 100% 97%  Weight:      Height:        Intake/Output Summary (Last 24 hours) at 07/07/2017 1034 Last data filed at 07/07/2017 0753 Gross per 24 hour  Intake 240 ml  Output -  Net 240 ml   Filed Weights   07/05/17 1737 07/06/17 0222  Weight: 72.6 kg (160 lb) 72.2 kg (159 lb 2.8 oz)    Examination:  General exam: Appears calm and comfortable  Respiratory system: Clear to auscultation. Respiratory effort normal. Cardiovascular system: S1 & S2 heard, RRR. No JVD, murmurs, rubs, gallops or clicks. No pedal edema. Gastrointestinal system: Abdomen is nondistended, soft and nontender. No organomegaly or masses felt. Normal bowel sounds heard. Central nervous system: Alert and oriented. No focal neurological deficits. Extremities: Symmetric 5 x 5 power. Skin: No rashes, lesions or ulcers     Data  Reviewed: I have personally reviewed following labs and imaging studies  CBC: Recent Labs  Lab 07/05/17 1752  WBC 8.8  HGB 12.0  HCT 38.2  MCV 92.7  PLT 267   Basic Metabolic Panel: Recent Labs  Lab 07/05/17 1752 07/07/17 0458  NA 145 145  K 4.3 3.8  CL 109 111  CO2 28 26  GLUCOSE 108* 92  BUN 23* 14  CREATININE 1.49* 0.90  CALCIUM 8.9 8.2*   GFR: Estimated Creatinine Clearance: 45.2 mL/min (by C-G formula based on SCr of 0.9 mg/dL). Liver Function Tests: No results for input(s): AST,  ALT, ALKPHOS, BILITOT, PROT, ALBUMIN in the last 168 hours. No results for input(s): LIPASE, AMYLASE in the last 168 hours. No results for input(s): AMMONIA in the last 168 hours. Coagulation Profile: No results for input(s): INR, PROTIME in the last 168 hours. Cardiac Enzymes: Recent Labs  Lab 07/06/17 0457 07/06/17 0723 07/06/17 1240  TROPONINI <0.03 <0.03 <0.03   BNP (last 3 results) No results for input(s): PROBNP in the last 8760 hours. HbA1C: No results for input(s): HGBA1C in the last 72 hours. CBG: No results for input(s): GLUCAP in the last 168 hours. Lipid Profile: No results for input(s): CHOL, HDL, LDLCALC, TRIG, CHOLHDL, LDLDIRECT in the last 72 hours. Thyroid Function Tests: No results for input(s): TSH, T4TOTAL, FREET4, T3FREE, THYROIDAB in the last 72 hours. Anemia Panel: No results for input(s): VITAMINB12, FOLATE, FERRITIN, TIBC, IRON, RETICCTPCT in the last 72 hours. Sepsis Labs: No results for input(s): PROCALCITON, LATICACIDVEN in the last 168 hours.  No results found for this or any previous visit (from the past 240 hour(s)).       Radiology Studies: Dg Chest 2 View  Result Date: 07/05/2017 CLINICAL DATA:  Chest pain EXAM: CHEST - 2 VIEW COMPARISON:  None. FINDINGS: No acute opacity or pleural effusion. Linear scar atelectasis in the left lower lung. Heart size within normal limits. Tortuous aorta with atherosclerosis. No pneumothorax. IMPRESSION: No active cardiopulmonary disease. Aortic tortuosity. Small amount of linear atelectasis or scarring in the left lower lung. Electronically Signed   By: Jasmine PangKim  Fujinaga M.D.   On: 07/05/2017 18:13        Scheduled Meds: . amLODipine  5 mg Oral Daily  . aspirin EC  81 mg Oral Daily  . heparin  5,000 Units Subcutaneous Q8H  . rivastigmine  4.6 mg Transdermal Daily  . rosuvastatin  10 mg Oral q1800   Continuous Infusions:   LOS: 0 days     Alwyn RenElizabeth G Eisha Chatterjee, MD [facility] If 7PM-7AM,  please contact night-coverage [email or web address] Password [email or web address] 07/07/2017, 10:34 AM

## 2017-07-07 NOTE — Progress Notes (Signed)
Granddaughter called this RN back upset after speaking with Dr. Jerolyn CenterMathews and requesting to speak to the administrator over the hospital.  [facility] who will contact the granddaughter.

## 2017-07-07 NOTE — Plan of Care (Signed)
Patient has been discharged, granddaughter refusing to pick patient up.  See multiple notes from multiple providers regarding this.  Patient has been stable, blood sugars stable, vital signs stable, no diarrhea, tolerating her heart healthy diet without issue.

## 2017-07-07 NOTE — Progress Notes (Signed)
Granddaughter called back, still adamant that she can not take patient home because of the diarrhea.  I assured the granddaughter that patient is not having any diarrhea, had one loose stool this morning and another continent stool.  Granddaughter says she knows how her grandmother's body is and because we have changed all her medications around she will have diarrhea and possibly even get dehydrated.  Will reach out to physician regarding granddaughters concerns.

## 2017-07-07 NOTE — Progress Notes (Signed)
Granddaughter Joann Mcgee came to patients room, this RN went to room and asked if she were here to take patient home. Joann Mcgee said no she was not taking the patient home, [facility]  I informed Joann Mcgee that patient is still discharged, has had no stools whatsoever since 9 a.m. And has been perfectly calm all day other than being upset Joann Mcgee would not come and get her.    [facility], granddaughter had asked about getting patient a Comptrollersitter.  Patient has had NO untoward behaviors whatsoever.  Patient calls out for staff and uses her walker when she has to go to the bathroom, has been sitting calmly in chair entire shift.  I informed Joann Mcgee of the above.  IV and tele removed earlier in the shift when patient discharged, all discharge papers including education on angina in patients room ready for patient discharge home with granddaughter.

## 2017-07-07 NOTE — Progress Notes (Signed)
This RN contacted physician regarding granddaughter refusing to come get patient d/t fears she might have diarrhea once she is discharged d/t medication changes.  .  Social work consult placed, spoke with Aundra MilletMegan SW who is going to reach out to granddaughter.  AC also notified of situation.

## 2017-07-07 NOTE — Progress Notes (Signed)
Attempted to contact granddaughter regarding patients discharge. Call went straight to voice mail.  Left message for granddaughter to contact me.

## 2017-07-07 NOTE — Progress Notes (Signed)
CSW notified that pt has been discharged, has dementia and lives with granddaughter, who has stated she feels uncomfortable taking pt home from hospital. Spoke with pt's granddaughter via phone. She states, "I am not refusing to take her home, I just think that if she is having diarrhea that needs to be addressed. That is not normal for her. I don't want to have her come home and get dehydrated from diarrhea." CSW reviewed chart with RN notes reporting incident of loose stool, no indication of ongoing diarrhea . Pt's granddaughter asked if she could speak with provider in order to discuss.  Updated attending and RN on conversation.  Ilean SkillMeghan Markala Sitts, MSW, LCSW Clinical Social Work 07/07/2017 [phone] weekend coverage

## 2017-07-08 DIAGNOSIS — R079 Chest pain, unspecified: Secondary | ICD-10-CM | POA: Diagnosis not present

## 2017-07-08 DIAGNOSIS — R0789 Other chest pain: Secondary | ICD-10-CM | POA: Diagnosis not present

## 2017-07-08 NOTE — Progress Notes (Signed)
New discharge instructions printed and reviewed with patients granddaughter. Questions concerns denied. Pt was A&Ox3, ambulatory with walker.

## 2017-07-08 NOTE — Progress Notes (Signed)
Patient had an uneventful night. No episodes of diarrhea noted.   VSS.

## 2017-07-08 NOTE — Discharge Summary (Deleted)
Physician Discharge Summary  Dell Libbey ZOX:[id number] DOB: Feb 14, 1930 DOA: 07/05/2017  [facility]  Admit date: 07/05/2017 Discharge date: 07/08/2017  Admitted From: Home Disposition: Home Recommendations for Outpatient Follow-up:  1. Follow up with PCP in 1-2 weeks/[facility]  2. Please obtain BMP/CBC in one week  Home Health none Equipment/Devices none  Discharge Condition stable CODE STATUS: Full code Diet recommendation cardiac Brief/Interim Summary:Narrative86 y.o.femalewith medical history significantfor DM, HTN,dementia,presented to the ED with reports of chest pain.Patient was brought in by her granddaughter-who patient resides withbutis not present at bedside,got voicemailwhen I called her number. Per chat-patient complained of intermittent,left-sided chest pain that radiates to her left arm,and similar occurrence 3 weeks ago requiring hospitalization.  Patient denies chest pain, orshortness of breath.He cannot tell me any provoking or relieving factors. Reports a mild chronic cough?If productive.Reports she fell 2days ago,when she gotoutof bed,her legs slid out from underneath her,as the floorisslippery.She has been ambulating since then, without pain.She reports last time she had chest pain was 2 weeks ago. Patient denies recent trip,denies swelling of her lower extremities,pain or redness.  ED Course:Blood pressure systolic 135- 164.Heart rate 59 -89.Chest x-ray-no acute abnormality,small amount of linear atelectasis or scarringLLL.WBC 8.8.Hemoglobin stable 12.Creatinine elevated 1.49. I-stattroponin negative.Hospitalist was called to admit for chest pain as she has acardiac history.    Discharge Diagnoses:  Principal Problem:   Chest pain Active Problems:   DM (diabetes mellitus) (HCC)   HTN (hypertension)   Dementia  1] chest pain this patient was admitted with atypical chest  pain ruled out for MI with negative cardiac enzymes and no acute EKG changes.  She was seen in consultation by cardiologist Dr. Jacinto HalimGanji.  An echocardiogram was not done because she had an echo a year ago.  He recommended to start her on Norvasc and to give her nitroglycerin sublingual tablets upon discharge.  He did not think this patient needs a stress test or further work-up because of her age and already, compromised renal function.  2] hypertension continue Norvasc.  3] AKI resolved with hydration hydrochlorothiazide and ACE inhibitor has been stopped at the time of discharge.  4]?  Apparent diarrhea patient's granddaughter did not pick her up on the day of discharge.  She mentioned that patient had diarrhea.  Prior to the day of discharge she had loose loose stools in the morning and after that she did not have any BM even at the time of discharge.  5] dementia continue Aricept.  Discharge Instructions  Discharge Instructions    Call MD for:  difficulty breathing, headache or visual disturbances   Complete by:  As directed    Call MD for:  extreme fatigue   Complete by:  As directed    Call MD for:  hives   Complete by:  As directed    Call MD for:  persistant dizziness or light-headedness   Complete by:  As directed    Call MD for:  persistant nausea and vomiting   Complete by:  As directed    Call MD for:  redness, tenderness, or signs of infection (pain, swelling, redness, odor or green/yellow discharge around incision site)   Complete by:  As directed    Call MD for:  severe uncontrolled pain   Complete by:  As directed    Call MD for:  temperature >100.4   Complete by:  As directed    Diet - low sodium heart healthy   Complete by:  As directed  Increase activity slowly   Complete by:  As directed      Allergies as of 07/08/2017   No Known Allergies     Medication List    STOP taking these medications   lisinopril-hydrochlorothiazide 10-12.5 MG tablet Commonly known  as:  PRINZIDE,ZESTORETIC   ZOCOR 40 MG tablet Generic drug:  simvastatin     TAKE these medications   amLODipine 5 MG tablet Commonly known as:  NORVASC Take 1 tablet (5 mg total) by mouth daily.   aspirin EC 81 MG tablet Take 81 mg by mouth daily.   MELATONIN PO Take 1 tablet by mouth at bedtime as needed (sleep).   nitroGLYCERIN 0.4 MG SL tablet Commonly known as:  NITROSTAT Place 1 tablet (0.4 mg total) under the tongue every 5 (five) minutes as needed for chest pain.   rivastigmine 4.6 mg/24hr Commonly known as:  EXELON Place 4.6 mg onto the skin daily.       No Known Allergies  Consultations:   dr Jacinto Halim   Procedures/Studies: Dg Chest 2 View  Result Date: 07/05/2017 CLINICAL DATA:  Chest pain EXAM: CHEST - 2 VIEW COMPARISON:  None. FINDINGS: No acute opacity or pleural effusion. Linear scar atelectasis in the left lower lung. Heart size within normal limits. Tortuous aorta with atherosclerosis. No pneumothorax. IMPRESSION: No active cardiopulmonary disease. Aortic tortuosity. Small amount of linear atelectasis or scarring in the left lower lung. Electronically Signed   By: Jasmine Pang M.D.   On: 07/05/2017 18:13    (Echo, Carotid, EGD, Colonoscopy, ERCP)    Subjective: Saw her this morning she is sitting up in her chair looking through magazines and waiting for breakfast.  She reported that she wanted to go home she had no pain or concerns or questions.  Discharge Exam: Vitals:   07/08/17 0536 07/08/17 1027  BP: (!) 146/88 (!) 133/95  Pulse: 72 75  Resp: 14   Temp: 97.9 F (36.6 C)   SpO2: 96%    Vitals:   07/07/17 1355 07/07/17 2054 07/08/17 0536 07/08/17 1027  BP: 127/80 138/88 (!) 146/88 (!) 133/95  Pulse: (!) 107 71 72 75  Resp: 16 16 14    Temp: 98.5 F (36.9 C) 98.1 F (36.7 C) 97.9 F (36.6 C)   TempSrc: Oral Oral Oral   SpO2: 98% 97% 96%   Weight:      Height:        General: Pt is alert, awake, not in acute  distress Cardiovascular: RRR, S1/S2 +, no rubs, no gallops Respiratory: CTA bilaterally, no wheezing, no rhonchi Abdominal: Soft, NT, ND, bowel sounds + Extremities: no edema, no cyanosis    The results of significant diagnostics from this hospitalization (including imaging, microbiology, ancillary and laboratory) are listed below for reference.     Microbiology: No results found for this or any previous visit (from the past 240 hour(s)).   Labs: BNP (last 3 results) No results for input(s): BNP in the last 8760 hours. Basic Metabolic Panel: Recent Labs  Lab 07/05/17 1752 07/07/17 0458  NA 145 145  K 4.3 3.8  CL 109 111  CO2 28 26  GLUCOSE 108* 92  BUN 23* 14  CREATININE 1.49* 0.90  CALCIUM 8.9 8.2*   Liver Function Tests: No results for input(s): AST, ALT, ALKPHOS, BILITOT, PROT, ALBUMIN in the last 168 hours. No results for input(s): LIPASE, AMYLASE in the last 168 hours. No results for input(s): AMMONIA in the last 168 hours. CBC: Recent Labs  Lab  07/05/17 1752  WBC 8.8  HGB 12.0  HCT 38.2  MCV 92.7  PLT 267   Cardiac Enzymes: Recent Labs  Lab 07/06/17 0457 07/06/17 0723 07/06/17 1240  TROPONINI <0.03 <0.03 <0.03   BNP: Invalid input(s): POCBNP CBG: Recent Labs  Lab 07/07/17 1231 07/07/17 2051  GLUCAP 83 136*   D-Dimer No results for input(s): DDIMER in the last 72 hours. Hgb A1c Recent Labs    07/07/17 1110  HGBA1C 5.7*   Lipid Profile No results for input(s): CHOL, HDL, LDLCALC, TRIG, CHOLHDL, LDLDIRECT in the last 72 hours. Thyroid function studies No results for input(s): TSH, T4TOTAL, T3FREE, THYROIDAB in the last 72 hours.  Invalid input(s): FREET3 Anemia work up No results for input(s): VITAMINB12, FOLATE, FERRITIN, TIBC, IRON, RETICCTPCT in the last 72 hours. Urinalysis No results found for: COLORURINE, APPEARANCEUR, LABSPEC, PHURINE, GLUCOSEU, HGBUR, BILIRUBINUR, KETONESUR, PROTEINUR, UROBILINOGEN, NITRITE,  LEUKOCYTESUR Sepsis Labs Invalid input(s): PROCALCITONIN,  WBC,  LACTICIDVEN Microbiology No results found for this or any previous visit (from the past 240 hour(s)).   Time coordinating discharge 43 minutes  SIGNED:   Alwyn Ren, MD  [facility] 07/08/2017, 4:11 PM Pager   If 7PM-7AM, please contact night-coverage [email or web address] Password [email or web address]

## 2017-07-08 NOTE — Progress Notes (Addendum)
Patient has been discharged. The pt's granddaughter Loma SenderYolanda Polizzi [phone]) was contacted regarding transportation. Per the granddaughter the pt experienced "diarrhea" two loose stools yesterday. Education provided regarding loose stools versus diarrhea. As a result of this, pts granddaughter feels she would be unable to take the pt home in that condition. The granddaughter was made aware the last BM was yesterday around 9 am. Per the Night RN there were no further episodes of N/V/D, CP and or any other discomforts. The granddaughter stated that she was awaiting a return call from the Administrative coordinator. [facility] to [facility] for update.

## 2017-07-09 LAB — GLUCOSE, CAPILLARY
Glucose-Capillary: 95 mg/dL (ref 70–99)
Glucose-Capillary: 97 mg/dL (ref 70–99)

## 2017-07-17 NOTE — Discharge Summary (Signed)
Physician Discharge Summary  Joann Mcgee [id number] DOB: 1930/05/23 DOA: 07/05/2017  [facility]  Admit date: 07/05/2017 Discharge date: 07/08/2017  Admitted From: Home Disposition: Home  Recommendations for Outpatient Follow-up:  1. Follow up with PCP in 1-2 weeks 2. Please obtain BMP/CBC in one week 3. Please follow up on the following pending results:  Home Health: None Equipment/Devices none Discharge Condition stable CODE STATUS full code Diet recommendation: Cardiac Brief/Interim Summary:84 y.o.femalewith medical history significantfor DM, HTN,dementia,presented to the ED with reports of chest pain.Patient was brought in by her granddaughter-who patient resides withbutis not present at bedside,got voicemailwhen I called her number. Per chat-patient complained of intermittent,left-sided chest pain that radiates to her left arm,and similar occurrence 3 weeks ago requiring hospitalization.  Patient denies chest pain, orshortness of breath.He cannot tell me any provoking or relieving factors. Reports a mild chronic cough?If productive.Reports she fell 2days ago,when she gotoutof bed,her legs slid out from underneath her,as the floorisslippery.She has been ambulating since then, without pain.She reports last time she had chest pain was 2 weeks ago. Patient denies recent trip,denies swelling of her lower extremities,pain or redness.  ED Course:Blood pressure systolic 135- 164.Heart rate 59 -89.Chest x-ray-no acute abnormality,small amount of linear atelectasis or scarringLLL.WBC 8.8.Hemoglobin stable 12.Creatinine elevated 1.49. I-stattroponin negative.Hospitalist was called to admit for chest pain as she has acardiac history.   Discharge Diagnoses:  Principal Problem:   Chest pain Active Problems:   DM (diabetes mellitus) (HCC)   HTN (hypertension)   Dementia 1] chest pain patient  ruled out for MI with negative cardiac enzymes no acute EKG changes.  Appreciate cardiology input.    2] hypertension patient started on Norvasc yesterday.  3] diabetes she is not on any medications at home or here.  4] AKI patient was on hydrochlorothiazide and ACE inhibitor at home which is being on hold at this time improving.  5] dementia  Continue Aricept.       Discharge Instructions  Discharge Instructions    Call MD for:  difficulty breathing, headache or visual disturbances   Complete by:  As directed    Call MD for:  extreme fatigue   Complete by:  As directed    Call MD for:  hives   Complete by:  As directed    Call MD for:  persistant dizziness or light-headedness   Complete by:  As directed    Call MD for:  persistant nausea and vomiting   Complete by:  As directed    Call MD for:  redness, tenderness, or signs of infection (pain, swelling, redness, odor or green/yellow discharge around incision site)   Complete by:  As directed    Call MD for:  severe uncontrolled pain   Complete by:  As directed    Call MD for:  temperature >100.4   Complete by:  As directed    Diet - low sodium heart healthy   Complete by:  As directed    Increase activity slowly   Complete by:  As directed      Allergies as of 07/08/2017   No Known Allergies     Medication List    STOP taking these medications   lisinopril-hydrochlorothiazide 10-12.5 MG tablet Commonly known as:  PRINZIDE,ZESTORETIC   ZOCOR 40 MG tablet Generic drug:  simvastatin     TAKE these medications   amLODipine 5 MG tablet Commonly known as:  NORVASC Take 1 tablet (5 mg total) by mouth daily.  aspirin EC 81 MG tablet Take 81 mg by mouth daily.   MELATONIN PO Take 1 tablet by mouth at bedtime as needed (sleep).   nitroGLYCERIN 0.4 MG SL tablet Commonly known as:  NITROSTAT Place 1 tablet (0.4 mg total) under the tongue every 5 (five) minutes as needed for chest pain.   rivastigmine 4.6  mg/24hr Commonly known as:  EXELON Place 4.6 mg onto the skin daily.       No Known Allergies  Consultations:   Procedures/Studies: Dg Chest 2 View  Result Date: 07/05/2017 CLINICAL DATA:  Chest pain EXAM: CHEST - 2 VIEW COMPARISON:  None. FINDINGS: No acute opacity or pleural effusion. Linear scar atelectasis in the left lower lung. Heart size within normal limits. Tortuous aorta with atherosclerosis. No pneumothorax. IMPRESSION: No active cardiopulmonary disease. Aortic tortuosity. Small amount of linear atelectasis or scarring in the left lower lung. Electronically Signed   By: Jasmine Pang M.D.   On: 07/05/2017 18:13    (Echo, Carotid, EGD, Colonoscopy, ERCP)    Subjective:   Discharge Exam: Vitals:   07/08/17 0536 07/08/17 1027  BP: (!) 146/88 (!) 133/95  Pulse: 72 75  Resp: 14   Temp: 97.9 F (36.6 C)   SpO2: 96%    Vitals:   07/07/17 1355 07/07/17 2054 07/08/17 0536 07/08/17 1027  BP: 127/80 138/88 (!) 146/88 (!) 133/95  Pulse: (!) 107 71 72 75  Resp: 16 16 14    Temp: 98.5 F (36.9 C) 98.1 F (36.7 C) 97.9 F (36.6 C)   TempSrc: Oral Oral Oral   SpO2: 98% 97% 96%   Weight:      Height:        General: Pt is alert, awake, not in acute distress Cardiovascular: RRR, S1/S2 +, no rubs, no gallops Respiratory: CTA bilaterally, no wheezing, no rhonchi Abdominal: Soft, NT, ND, bowel sounds + Extremities: no edema, no cyanosis    The results of significant diagnostics from this hospitalization (including imaging, microbiology, ancillary and laboratory) are listed below for reference.     Microbiology: No results found for this or any previous visit (from the past 240 hour(s)).   Labs: BNP (last 3 results) No results for input(s): BNP in the last 8760 hours. Basic Metabolic Panel: No results for input(s): NA, K, CL, CO2, GLUCOSE, BUN, CREATININE, CALCIUM, MG, PHOS in the last 168 hours. Liver Function Tests: No results for input(s): AST, ALT,  ALKPHOS, BILITOT, PROT, ALBUMIN in the last 168 hours. No results for input(s): LIPASE, AMYLASE in the last 168 hours. No results for input(s): AMMONIA in the last 168 hours. CBC: No results for input(s): WBC, NEUTROABS, HGB, HCT, MCV, PLT in the last 168 hours. Cardiac Enzymes: No results for input(s): CKTOTAL, CKMB, CKMBINDEX, TROPONINI in the last 168 hours. BNP: Invalid input(s): POCBNP CBG: No results for input(s): GLUCAP in the last 168 hours. D-Dimer No results for input(s): DDIMER in the last 72 hours. Hgb A1c No results for input(s): HGBA1C in the last 72 hours. Lipid Profile No results for input(s): CHOL, HDL, LDLCALC, TRIG, CHOLHDL, LDLDIRECT in the last 72 hours. Thyroid function studies No results for input(s): TSH, T4TOTAL, T3FREE, THYROIDAB in the last 72 hours.  Invalid input(s): FREET3 Anemia work up No results for input(s): VITAMINB12, FOLATE, FERRITIN, TIBC, IRON, RETICCTPCT in the last 72 hours. Urinalysis No results found for: COLORURINE, APPEARANCEUR, LABSPEC, PHURINE, GLUCOSEU, HGBUR, BILIRUBINUR, KETONESUR, PROTEINUR, UROBILINOGEN, NITRITE, LEUKOCYTESUR Sepsis Labs Invalid input(s): PROCALCITONIN,  WBC,  LACTICIDVEN Microbiology No results  found for this or any previous visit (from the past 240 hour(s)).   Time coordinating discharge: 33 minutes  SIGNED:   Alwyn Ren, MD  [facility] 07/17/2017, 12:58 PM Pager   If 7PM-7AM, please contact night-coverage [email or web address] Password [email or web address]

## 2018-04-09 ENCOUNTER — Encounter (HOSPITAL_COMMUNITY): Payer: Self-pay | Admitting: Emergency Medicine

## 2018-04-09 ENCOUNTER — Emergency Department (HOSPITAL_COMMUNITY): Payer: Medicare Other

## 2018-04-09 ENCOUNTER — Encounter (HOSPITAL_COMMUNITY): Admission: EM | Disposition: E | Payer: Self-pay | Source: Skilled Nursing Facility | Attending: Pulmonary Disease

## 2018-04-09 DIAGNOSIS — I447 Left bundle-branch block, unspecified: Secondary | ICD-10-CM | POA: Diagnosis present

## 2018-04-09 DIAGNOSIS — I442 Atrioventricular block, complete: Secondary | ICD-10-CM | POA: Diagnosis present

## 2018-04-09 DIAGNOSIS — F0390 Unspecified dementia without behavioral disturbance: Secondary | ICD-10-CM | POA: Diagnosis present

## 2018-04-09 DIAGNOSIS — I2119 ST elevation (STEMI) myocardial infarction involving other coronary artery of inferior wall: Secondary | ICD-10-CM | POA: Diagnosis present

## 2018-04-09 DIAGNOSIS — Z515 Encounter for palliative care: Secondary | ICD-10-CM | POA: Diagnosis present

## 2018-04-09 DIAGNOSIS — E119 Type 2 diabetes mellitus without complications: Secondary | ICD-10-CM | POA: Diagnosis present

## 2018-04-09 DIAGNOSIS — I213 ST elevation (STEMI) myocardial infarction of unspecified site: Secondary | ICD-10-CM

## 2018-04-09 DIAGNOSIS — F17210 Nicotine dependence, cigarettes, uncomplicated: Secondary | ICD-10-CM | POA: Diagnosis present

## 2018-04-09 DIAGNOSIS — I462 Cardiac arrest due to underlying cardiac condition: Secondary | ICD-10-CM | POA: Diagnosis present

## 2018-04-09 DIAGNOSIS — I10 Essential (primary) hypertension: Secondary | ICD-10-CM | POA: Diagnosis present

## 2018-04-09 DIAGNOSIS — M1990 Unspecified osteoarthritis, unspecified site: Secondary | ICD-10-CM | POA: Diagnosis present

## 2018-04-09 DIAGNOSIS — Z7982 Long term (current) use of aspirin: Secondary | ICD-10-CM

## 2018-04-09 DIAGNOSIS — I4901 Ventricular fibrillation: Secondary | ICD-10-CM | POA: Diagnosis present

## 2018-04-09 DIAGNOSIS — Z66 Do not resuscitate: Secondary | ICD-10-CM | POA: Diagnosis present

## 2018-04-09 DIAGNOSIS — I469 Cardiac arrest, cause unspecified: Secondary | ICD-10-CM | POA: Diagnosis present

## 2018-04-09 DIAGNOSIS — J9601 Acute respiratory failure with hypoxia: Secondary | ICD-10-CM | POA: Diagnosis present

## 2018-04-09 LAB — COMPREHENSIVE METABOLIC PANEL
ALT: 535 U/L — AB (ref 0–44)
AST: 485 U/L — ABNORMAL HIGH (ref 15–41)
Albumin: 1.9 g/dL — ABNORMAL LOW (ref 3.5–5.0)
Alkaline Phosphatase: 46 U/L (ref 38–126)
Anion gap: 17 — ABNORMAL HIGH (ref 5–15)
BUN: 24 mg/dL — ABNORMAL HIGH (ref 8–23)
CO2: 13 mmol/L — ABNORMAL LOW (ref 22–32)
Calcium: 7 mg/dL — ABNORMAL LOW (ref 8.9–10.3)
Chloride: 112 mmol/L — ABNORMAL HIGH (ref 98–111)
Creatinine, Ser: 1.52 mg/dL — ABNORMAL HIGH (ref 0.44–1.00)
GFR calc Af Amer: 36 mL/min — ABNORMAL LOW (ref >60)
GFR calc non Af Amer: 31 mL/min — ABNORMAL LOW (ref >60)
Glucose, Bld: 422 mg/dL — ABNORMAL HIGH (ref 70–99)
Potassium: 3.4 mmol/L — ABNORMAL LOW (ref 3.5–5.1)
SODIUM: 142 mmol/L (ref 135–145)
Total Bilirubin: 0.6 mg/dL (ref 0.3–1.2)
Total Protein: 4.4 g/dL — ABNORMAL LOW (ref 6.5–8.1)

## 2018-04-09 LAB — CBC WITH DIFFERENTIAL/PLATELET
Abs Immature Granulocytes: 0.8 10*3/uL — ABNORMAL HIGH (ref 0.00–0.07)
Basophils Absolute: 0 10*3/uL (ref 0.0–0.1)
Basophils Relative: 0 %
Eosinophils Absolute: 0.2 10*3/uL (ref 0.0–0.5)
Eosinophils Relative: 1 %
HEMATOCRIT: 28.8 % — AB (ref 36.0–46.0)
Hemoglobin: 8.1 g/dL — ABNORMAL LOW (ref 12.0–15.0)
Lymphocytes Relative: 16 %
Lymphs Abs: 2.6 10*3/uL (ref 0.7–4.0)
MCH: 28.8 pg (ref 26.0–34.0)
MCHC: 28.1 g/dL — ABNORMAL LOW (ref 30.0–36.0)
MCV: 102.5 fL — ABNORMAL HIGH (ref 80.0–100.0)
MYELOCYTES: 3 %
Metamyelocytes Relative: 2 %
Monocytes Absolute: 0 10*3/uL — ABNORMAL LOW (ref 0.1–1.0)
Monocytes Relative: 0 %
Neutro Abs: 12.9 10*3/uL — ABNORMAL HIGH (ref 1.7–7.7)
Neutrophils Relative %: 78 %
Platelets: 134 10*3/uL — ABNORMAL LOW (ref 150–400)
RBC: 2.81 MIL/uL — ABNORMAL LOW (ref 3.87–5.11)
RDW: 13.9 % (ref 11.5–15.5)
WBC: 16.5 10*3/uL — ABNORMAL HIGH (ref 4.0–10.5)
nRBC: 0 % (ref 0.0–0.2)
nRBC: 0 /100 WBC

## 2018-04-09 LAB — POCT I-STAT 7, (LYTES, BLD GAS, ICA,H+H)
Acid-base deficit: 17 mmol/L — ABNORMAL HIGH (ref 0.0–2.0)
BICARBONATE: 12.1 mmol/L — AB (ref 20.0–28.0)
Calcium, Ion: 1.04 mmol/L — ABNORMAL LOW (ref 1.15–1.40)
HCT: 21 % — ABNORMAL LOW (ref 36.0–46.0)
Hemoglobin: 7.1 g/dL — ABNORMAL LOW (ref 12.0–15.0)
O2 Saturation: 86 %
Potassium: 4.2 mmol/L (ref 3.5–5.1)
SODIUM: 144 mmol/L (ref 135–145)
TCO2: 13 mmol/L — ABNORMAL LOW (ref 22–32)
pCO2 arterial: 40.6 mmHg (ref 32.0–48.0)
pH, Arterial: 7.074 — CL (ref 7.350–7.450)
pO2, Arterial: 66 mmHg — ABNORMAL LOW (ref 83.0–108.0)

## 2018-04-09 LAB — LACTIC ACID, PLASMA: Lactic Acid, Venous: 9 mmol/L (ref 0.5–1.9)

## 2018-04-09 LAB — TROPONIN I: Troponin I: 0.4 ng/mL (ref <0.03)

## 2018-04-09 LAB — MAGNESIUM: Magnesium: 2.2 mg/dL (ref 1.7–2.4)

## 2018-04-09 LAB — TRIGLYCERIDES: TRIGLYCERIDES: 57 mg/dL (ref <150)

## 2018-04-09 SURGERY — LEFT HEART CATH AND CORONARY ANGIOGRAPHY
Anesthesia: LOCAL

## 2018-04-09 MED ORDER — FENTANYL CITRATE (PF) 100 MCG/2ML IJ SOLN: 50.0000 ug | INTRAMUSCULAR | Status: DC | PRN

## 2018-04-09 MED ORDER — FAMOTIDINE 40 MG/5ML PO SUSR: 20.0000 mg | Freq: Two times a day (BID) | ORAL | Status: DC

## 2018-04-09 MED ORDER — ACETAMINOPHEN 325 MG PO TABS: 650.0000 mg | ORAL_TABLET | ORAL | Status: DC | PRN

## 2018-04-09 MED ORDER — DOPAMINE-DEXTROSE 3.2-5 MG/ML-% IV SOLN
0.0000 ug/kg/min | INTRAVENOUS | Status: DC
  Administered 2018-04-09: 5 ug/kg/min via INTRAVENOUS

## 2018-04-09 MED ORDER — EPINEPHRINE PF 1 MG/10ML IJ SOSY
PREFILLED_SYRINGE | INTRAMUSCULAR | Status: AC | PRN
  Administered 2018-04-09 (×2): 1 via INTRAVENOUS

## 2018-04-09 MED ORDER — SODIUM CHLORIDE 0.9 % IV SOLN: INTRAVENOUS | Status: DC

## 2018-04-09 MED ORDER — PROPOFOL 1000 MG/100ML IV EMUL: 0.0000 ug/kg/min | INTRAVENOUS | Status: DC

## 2018-04-09 MED ORDER — DOPAMINE-DEXTROSE 3.2-5 MG/ML-% IV SOLN
0.0000 ug/kg/min | INTRAVENOUS | Status: DC
  Administered 2018-04-09: 10 ug/kg/min via INTRAVENOUS

## 2018-04-09 MED ORDER — MIDAZOLAM HCL 2 MG/2ML IJ SOLN: 1.0000 mg | INTRAMUSCULAR | Status: DC | PRN

## 2018-04-09 MED ORDER — ETOMIDATE 2 MG/ML IV SOLN
INTRAVENOUS | Status: AC | PRN
  Administered 2018-04-09: 18 mg via INTRAVENOUS

## 2018-04-09 MED ORDER — SODIUM CHLORIDE 0.9 % IV SOLN
INTRAVENOUS | Status: AC | PRN
  Administered 2018-04-09: 1000 mL via INTRAVENOUS

## 2018-04-09 MED ORDER — ATROPINE SULFATE 1 MG/ML IJ SOLN
INTRAMUSCULAR | Status: AC | PRN
  Administered 2018-04-09: 1 mg via INTRAVENOUS

## 2018-04-09 MED ORDER — ONDANSETRON HCL 4 MG/2ML IJ SOLN: 4.0000 mg | Freq: Four times a day (QID) | INTRAMUSCULAR | Status: DC | PRN

## 2018-04-09 MED ORDER — ROCURONIUM BROMIDE 50 MG/5ML IV SOLN
INTRAVENOUS | Status: AC | PRN
  Administered 2018-04-09: 60 mg via INTRAVENOUS

## 2018-04-09 MED ORDER — DOCUSATE SODIUM 50 MG/5ML PO LIQD: 100.0000 mg | Freq: Two times a day (BID) | ORAL | Status: DC | PRN

## 2018-04-09 MED ORDER — MORPHINE SULFATE (PF) 2 MG/ML IV SOLN: 2.0000 mg | INTRAVENOUS | Status: DC | PRN

## 2018-04-09 MED ORDER — HEPARIN SODIUM (PORCINE) 5000 UNIT/ML IJ SOLN: 5000.0000 [IU] | Freq: Three times a day (TID) | INTRAMUSCULAR | Status: DC

## 2018-04-09 MED ORDER — SODIUM CHLORIDE 0.9 % IV SOLN: 250.0000 mL | INTRAVENOUS | Status: DC

## 2018-04-09 MED ORDER — PROPOFOL 1000 MG/100ML IV EMUL
INTRAVENOUS | Status: AC
  Filled 2018-04-09: qty 100

## 2018-04-16 NOTE — Code Documentation (Signed)
MD removing EMS airway, and intubating.

## 2018-04-16 NOTE — Code Documentation (Signed)
Paused pacing for atropine. No improvement with underlying bradycardia. Resumed pacing

## 2018-04-16 NOTE — Procedures (Signed)
Extubation Procedure Note  Patient Details:   Name: Joann Mcgee DOB: [birth year] MRN: [id number]   Airway Documentation:    Vent end date: 04/15/2018 Vent end time: 0910   Evaluation  O2 sats: stable throughout Complications: No apparent complications Patient did not tolerate procedure well. Bilateral Breath Sounds: Diminished, Clear   No   Patient extubated per MD. Patient is now comfort care. RN at bedside.   Rance Muir April 15, 2018, 9:45 AM

## 2018-04-16 NOTE — ED Notes (Signed)
Dr. Jacinto Halim at bedside. Verbal orders to stop dopamine. Begin comfort care. Pt will remain intubated at this time. DNR

## 2018-04-16 NOTE — ED Notes (Signed)
Total CPR time- 0615- 5726 per EMS

## 2018-04-16 NOTE — ED Notes (Signed)
Respiratory at bedside to extubate, per EDP verbal order

## 2018-04-16 NOTE — Code Documentation (Addendum)
Underlying rhythm 3rd degree block. Pt continues to have pulse and is being paced

## 2018-04-16 NOTE — ED Provider Notes (Signed)
[facility] EMERGENCY DEPARTMENT Provider Note   CSN: [id number] Arrival date & time: 04-18-2018  6333    History   Chief Complaint Chief Complaint  Patient presents with  . Cardiac Arrest    HPI Joann Mcgee is a 85 y.o. female.    Level 5 caveat due to unresponsiveness. HPI Patient presents after cardiac arrest.  Had been seen at nursing home around 10 minutes prior but then found patient in ventricular fibrillation.  CPR started.  After 3 epinephrines 6 defibrillations 300 mg of amiodarone and epinephrine drip and then pacing patient was brought to the ER.  Had had a King airway placed.  Code STEMI had been called prehospital and Dr. Angelena Form saw patient in ER. Past Medical History:  Diagnosis Date  . Arthritis   . Dementia ([place])   . Diabetes mellitus without complication ([place])   . Hypertension     Patient Active Problem List   Diagnosis Date Noted  . Cardiac arrest ([place]) 04-18-2018  . Chest pain 07/06/2017  . DM (diabetes mellitus) ([place]) 07/06/2017  . HTN (hypertension) 07/06/2017  . Dementia ([place]) 07/06/2017    Past Surgical History:  Procedure Laterality Date  . ABDOMINAL HYSTERECTOMY    . CARDIAC SURGERY       OB History   No obstetric history on file.      Home Medications    Prior to Admission medications   Medication Sig Start Date End Date Taking? Authorizing Provider  amLODipine (NORVASC) 5 MG tablet Take 1 tablet (5 mg total) by mouth daily. 07/08/17   Georgette Shell, MD  aspirin EC 81 MG tablet Take 81 mg by mouth daily.    [provider]  MELATONIN PO Take 1 tablet by mouth at bedtime as needed (sleep).    [provider]  nitroGLYCERIN (NITROSTAT) 0.4 MG SL tablet Place 1 tablet (0.4 mg total) under the tongue every 5 (five) minutes as needed for chest pain. 07/07/17   Georgette Shell, MD  rivastigmine (EXELON) 4.6 mg/24hr Place 4.6 mg onto the skin daily.     [provider]    Family  History History reviewed. No pertinent family history.  Social History Social History   Tobacco Use  . Smoking status: Current Every Day Smoker    Types: Cigarettes  . Smokeless tobacco: Never Used  Substance Use Topics  . Alcohol use: Not Currently  . Drug use: Not on file     Allergies   Patient has no known allergies.   Review of Systems Review of Systems  Unable to perform ROS: Acuity of condition     Physical Exam Updated Vital Signs BP (!) 44/33   Pulse (!) 0   Temp (!) 96.1 F (35.6 C) (Rectal)   Resp (!) 0   Ht _0  (1.6 m)   Wt 72.2 kg   SpO2 91%   BMI 28.20 kg/m   Physical Exam Vitals signs and nursing note reviewed.  HENT:     Head: Atraumatic.     Mouth/Throat:     Comments: Patient has King airway in place Eyes:     Comments: Pupils unreactive.  Cardiovascular:     Rate and Rhythm: Bradycardia present.     Comments: Patient is paced.  Bradycardia with third-degree block under pacing. Pulmonary:     Comments: Equal breath sounds bilaterally. Abdominal:     General: There is no distension.  Musculoskeletal:     Right lower leg: Edema present.  Left lower leg: Edema present.  Skin:    Comments: Abrasion to left breast.  [facility].  Neurological:     Mental Status: She is alert.     Comments: Unresponsive.  Breathing above vent but not gagging.  Pupils unreactive.  No response to pain.      ED Treatments / Results  Labs (all labs ordered are listed, but only abnormal results are displayed) Labs Reviewed  TROPONIN I - Abnormal; Notable for the following components:      Result Value   Troponin I 0.40 (*)    All other components within normal limits  CBC WITH DIFFERENTIAL/PLATELET - Abnormal; Notable for the following components:   WBC 16.5 (*)    RBC 2.81 (*)    Hemoglobin 8.1 (*)    HCT 28.8 (*)    MCV 102.5 (*)    MCHC 28.1 (*)    Platelets 134 (*)    All other components within normal limits   COMPREHENSIVE METABOLIC PANEL - Abnormal; Notable for the following components:   Potassium 3.4 (*)    Chloride 112 (*)    CO2 13 (*)    Glucose, Bld 422 (*)    BUN 24 (*)    Creatinine, Ser 1.52 (*)    Calcium 7.0 (*)    Total Protein 4.4 (*)    Albumin 1.9 (*)    AST 485 (*)    ALT 535 (*)    GFR calc non Af Amer 31 (*)    GFR calc Af Amer 36 (*)    Anion gap 17 (*)    All other components within normal limits  LACTIC ACID, PLASMA - Abnormal; Notable for the following components:   Lactic Acid, Venous 9.0 (*)    All other components within normal limits  POCT I-STAT 7, (LYTES, BLD GAS, ICA,H+H) - Abnormal; Notable for the following components:   pH, Arterial 7.074 (*)    pO2, Arterial 66.0 (*)    Bicarbonate 12.1 (*)    TCO2 13 (*)    Acid-base deficit 17.0 (*)    Calcium, Ion 1.04 (*)    HCT 21.0 (*)    Hemoglobin 7.1 (*)    All other components within normal limits  TRIGLYCERIDES  MAGNESIUM  BLOOD GAS, ARTERIAL    EKG EKG Interpretation  Date/Time:  04-25-2018 07:19:46 EDT Ventricular Rate:  37 PR Interval:    QRS Duration: 179 QT Interval:  521 QTC Calculation: 409 R Axis:   -76 Text Interpretation:  Third degree heart block Right bundle branch block LVH with IVCD and secondary repol abnrm Inferior infarct, acute (RCA) Lateral leads are also involved Probable RV involvement, suggest recording right precordial leads >>> Acute MI <<< Confirmed by Davonna Belling [phone]) on Apr 25, 2018 7:58:41 AM   EKG Interpretation  Date/Time:  04/25/18 08:18:26 EDT Ventricular Rate:  108 PR Interval:    QRS Duration: 188 QT Interval:  431 QTC Calculation: 578 R Axis:   104 Text Interpretation:  Atrial flutter IVCD, consider atypical LBBB inferior ST elevation Confirmed by Davonna Belling [phone]) on 04-25-18 9:31:58 AM       Radiology Dg Chest Port 1 View  Result Date: 04-25-18 CLINICAL DATA:  Hypoxia/status post cardiac arrest EXAM:  PORTABLE CHEST 1 VIEW COMPARISON:  Starla 21, 2019 FINDINGS: Endotracheal tube tip is 2.7 cm above the carina. Nasogastric tube tip and side port are below the diaphragm. No pneumothorax. There is cardiomegaly with pulmonary  venous hypertension. There is interstitial edema throughout the lungs bilaterally. There is atelectasis in the bases. No adenopathy. There is aortic atherosclerosis. No bone lesions. IMPRESSION: Tube positions as described without pneumothorax. Cardiomegaly with pulmonary vascular congestion. Interstitial edema. There is felt to be a degree of underlying congestive heart failure. Bibasilar atelectasis. No frank consolidation. Electronically Signed   By: Lowella Grip III M.D.   On: May 03, 2018 07:47    Procedures Procedure Name: Intubation Date/Time: 2018/05/03 9:29 AM Performed by: Davonna Belling, MD Pre-anesthesia Checklist: Patient being monitored Preoxygenation: Pre-oxygenation with 100% oxygen Induction Type: Rapid sequence Laryngoscope Size: Glidescope and 4 Tube type: Subglottic suction tube Tube size: 7.5 mm Number of attempts: 1 Secured at: 21 cm Dental Injury: Teeth and Oropharynx as per pre-operative assessment      King airway removed and 7.5 ET tube placed.  Patient had etomidate and rocuronium given at around 730.  (including critical care time)  Medications Ordered in ED Medications  propofol (DIPRIVAN) 1000 MG/100ML infusion (has no administration in time range)  DOPamine (INTROPIN) 800 mg in dextrose 5 % 250 mL (3.2 mg/mL) infusion (0 mcg/kg/min  72.2 kg Intravenous Stopped 2018-05-03 0847)  acetaminophen (TYLENOL) tablet 650 mg (has no administration in time range)  0.9 %  sodium chloride infusion (has no administration in time range)  morphine 2 MG/ML injection 2 mg (has no administration in time range)  EPINEPHrine (ADRENALIN) 1 MG/10ML injection (1 Syringe Intravenous Given 2018-05-03 0739)  etomidate (AMIDATE) injection (18 mg Intravenous Given  May 03, 2018 0730)  rocuronium (ZEMURON) injection (60 mg Intravenous Given 2018-05-03 0730)  atropine injection (1 mg Intravenous Given 05/03/2018 0801)  0.9 %  sodium chloride infusion (1,000 mLs Intravenous New Bag/Given May 03, 2018 0829)     Initial Impression / Assessment and Plan / ED Course  I have reviewed the triage vital signs and the nursing notes.  Pertinent labs & imaging results that were available during my care of the patient were reviewed by me and considered in my medical decision making (see chart for details).        Patient brought after cardiac arrest with inferior MI and third-degree heart block.  Met in the ER by Dr. Angelena Form.  He discussed with the patient's granddaughter Joann Mcgee.  Patient had baseline dementia.  There had been discussion of DNR previously.  After discussion with Dr. Einar Gip, the patient's primary cardiologist and family members decision made not to take her to the Cath Lab or place pacing wire.  Patient started on dopamine it increased.  Fluid boluses given.  King airway change over to ET tube by me.  Patient initially seen by ICU for admission.  However Dr. Einar Gip had further discussion with patient's family and patient was made comfort care only.  Patient no longer breathing above the vent and the initial sedation and paralysis given should have metabolized.  Patient was extubated and expired at 9:19 AM.  Clergy was present.  I discussed with Joann Mcgee, the patient's granddaughter.  CRITICAL CARE Performed by: Davonna Belling Total critical care time: 60 minutes Critical care time was exclusive of separately billable procedures and treating other patients. Critical care was necessary to treat or prevent imminent or life-threatening deterioration. Critical care was time spent personally by me on the following activities: development of treatment plan with patient and/or surrogate as well as nursing, discussions with consultants, evaluation of patient's response to  treatment, examination of patient, obtaining history from patient or surrogate, ordering and performing treatments and interventions, ordering and review  of laboratory studies, ordering and review of radiographic studies, pulse oximetry and re-evaluation of patient's condition.     Final Clinical Impressions(s) / ED Diagnoses   Final diagnoses:  Cardiac arrest [facility])  Ventricular fibrillation ([place])  Third degree heart block ([place])  ST elevation myocardial infarction (STEMI), unspecified artery [facility])    ED Discharge Orders    None       Davonna Belling, MD 04-19-18 [phone]

## 2018-04-16 NOTE — Progress Notes (Signed)
I had 15 min then another 15 min  telephone discussions  with the patient's granddaughters, first granddaughter  Joann Mcgee has met me in the past, and advised her that the chances of survival is extremely low, in spite of full dose dopamine and ventilator support, systolic blood pressure in 14s.  They are awaiting for another sister to come on line before they make her comfort.  For now we will discontinue all medication and to stick to effort except continue ventilator support for now.

## 2018-04-16 NOTE — Consult Note (Signed)
NAME:  Joann Mcgee, MRN:  [id number], DOB:  04/05/1930, LOS: 0 ADMISSION DATE:  2018-04-22, CONSULTATION DATE: 3/25 REFERRING MD:  Dr. Rubin Payor, CHIEF COMPLAINT:  Cardiac arrest    Brief History   85 y/o F admitted from nursing facility with VF arrest, suspected inferior MI with CHB requiring pacing.    History of Present Illness   85 y/o F who presented to [facility] on 3/25 from nursing facility with reports of VF arrest.  At baseline, the patient has dementia but does get up and move around the facility per report.  The patient was reportedly seen 10 minutes before the events occurred.  She had an approximate 15-20 minute downtime (CPR time [phone] per EMS).  Initial rhythm VF.  She was shocked 6 times, given 300 mg amiodarone, 3 epinephrine pushes and started on an EPI gtt per EMS.  The gtt ran out in route and the patient became bradycardic with rates in the 30's.  EMS initiated pacing.  She was intubated with a King Airway.  In the ER, the patients airway was exchanged for an ETT. Initial labs- Na 145, K 3.8, Cl 111, CO2 26, glucose 92, BUN 14, Sr Cr 0.90, lactic acid 9, WBC 16.5, Hgb 8.1, MCV 102.5 and platelets 134. ABG 7.074.  CXR showed vascular congestion / pulmonary edema.   Cardiology consulted and felt she was too unstable for LHC / interventions.    PCCM called for ICU admission.    Past Medical History  Dementia  HTN DM  Arthritis   Significant Hospital Events   3/25  Admit post VF arrest   Consults:  Cardiology   Procedures:  ETT 3/25 >>   Significant Diagnostic Tests:    Micro Data:  Tracheal aspirate 3/25 >>  UA 3/25 >>   Antimicrobials:     Interim history/subjective:  As above   Objective   Blood pressure (!) 77/64, pulse (!) 49, resp. rate (!) 8, height 5\' 3"  (1.6 m), weight 72.2 kg, SpO2 98 %.    FiO2 (%):  [100 %] 100 % Set Rate:  [18 bmp] 18 bmp Vt Set:  [500 mL] 500 mL PEEP:  [5 cmH20] 5 cmH20  No intake or output data in the 24 hours ending  04/22/2018 0823 Filed Weights   04-22-2018 0753  Weight: 72.2 kg    Examination: General: critically ill appearing elderly female lying on stretcher  HEENT: MM pink/dry, no jvd Neuro: sedate post ETT change, per report no movement / response prior to tube exchange CV: external pacer, paced rhythm, difficult to auscultate heart tones PULM: even/non-labored, lungs bilaterally coarse  GI: soft, bsx4 hypoactive Extremities: warm/dry, trace BLE edema  Skin: no rashes or lesions  Resolved Hospital Problem list      Assessment & Plan:   VF Arrest  -with suspected RCA involvement  -CHB on EKG requiring pacing  P: Continue dopamine gtt, Extensive discussion with family regarding goals of care.  See below  External pacing for now > family trying to decide on goals of care Admit to ICU  Not a candidate for TTM given dementia Follow troponin, BNP  NS @ 68ml/hr given concern for RV involvement  Too unstable for LHC at this point / further pacer intervention  Acute Hypoxic Respiratory Failure  -in setting of VF arrest  P: PRVC 8cc/kg Wean PEEP/FiO2 for sats > 90% Follow up ABG Trend intermittent CXR   DM  P: SSI  Hx HTN P: Tele monitoring   Best  practice:  Diet: NPO  Pain/Anxiety/Delirium protocol (if indicated): in place  VAP protocol (if indicated): in place  DVT prophylaxis: heparin  GI prophylaxis: pepcid  Glucose control: SSI  Mobility: bed rest  Code Status: full code  Family Communication: Discussed patients state of health, events leading up to cardiac arrest this am with Debarah Crape, granddaughter [phone]).  She indicates understanding of events and patients critical illness. We reviewed that CPR would not be in her best interest as she is too unstable to take for LHC / pacer at this point.  She wants to get back in touch her family and then return call.   Disposition: ICU   Labs   CBC: Recent Labs  Lab 04/29/18 0751  WBC 16.5*  NEUTROABS PENDING   HGB 8.1*  HCT 28.8*  MCV 102.5*  PLT 134*    Basic Metabolic Panel: No results for input(s): NA, K, CL, CO2, GLUCOSE, BUN, CREATININE, CALCIUM, MG, PHOS in the last 168 hours. GFR: CrCl cannot be calculated (Patient's most recent lab result is older than the maximum 21 days allowed.). Recent Labs  Lab April 29, 2018 0751  WBC 16.5*    Liver Function Tests: No results for input(s): AST, ALT, ALKPHOS, BILITOT, PROT, ALBUMIN in the last 168 hours. No results for input(s): LIPASE, AMYLASE in the last 168 hours. No results for input(s): AMMONIA in the last 168 hours.  ABG No results found for: PHART, PCO2ART, PO2ART, HCO3, TCO2, ACIDBASEDEF, O2SAT   Coagulation Profile: No results for input(s): INR, PROTIME in the last 168 hours.  Cardiac Enzymes: No results for input(s): CKTOTAL, CKMB, CKMBINDEX, TROPONINI in the last 168 hours.  HbA1C: Hgb A1c MFr Bld  Date/Time Value Ref Range Status  07/07/2017 11:10 AM 5.7 (H) 4.8 - 5.6 % Final    Comment:    (NOTE) Pre diabetes:          5.7%-6.4% Diabetes:              >6.4% Glycemic control for   <7.0% adults with diabetes     CBG: No results for input(s): GLUCAP in the last 168 hours.  Review of Systems:   Unable to complete as patient is altered on mechanical ventilation post arrest.   Past Medical History  She,  has a past medical history of Arthritis, Dementia (HCC), Diabetes mellitus without complication (HCC), and Hypertension.   Surgical History    Past Surgical History:  Procedure Laterality Date  . ABDOMINAL HYSTERECTOMY    . CARDIAC SURGERY       Social History   reports that she has been smoking cigarettes. She has never used smokeless tobacco. She reports previous alcohol use.   Family History   Her family history is not on file.   Allergies No Known Allergies   Home Medications  Prior to Admission medications   Medication Sig Start Date End Date Taking? Authorizing Provider  amLODipine (NORVASC) 5 MG  tablet Take 1 tablet (5 mg total) by mouth daily. 07/08/17   Alwyn Ren, MD  aspirin EC 81 MG tablet Take 81 mg by mouth daily.    [provider]  MELATONIN PO Take 1 tablet by mouth at bedtime as needed (sleep).    [provider]  nitroGLYCERIN (NITROSTAT) 0.4 MG SL tablet Place 1 tablet (0.4 mg total) under the tongue every 5 (five) minutes as needed for chest pain. 07/07/17   Alwyn Ren, MD  rivastigmine (EXELON) 4.6 mg/24hr Place 4.6 mg onto the skin  daily.     [provider]     Critical care time: time spent coordinating care > 70 minutes    Canary Brim, NP-C [facility] Pgr: [phone] or if no answer [phone] 05-01-18, 8:23 AM

## 2018-04-16 NOTE — Progress Notes (Signed)
Dr. Jacinto Halim discussed plan of care with family > DNR.  He reviewed change of plan with Dr. Denese Killings.  Patient is full comfort care.  Plan to withdrawal mechanical ventilation and vasopressor support in ER.     Joann Brim, NP-C [facility] Pgr: [phone] or if no answer [phone] 2018/04/16, 9:27 AM

## 2018-04-16 NOTE — ED Notes (Signed)
RN, NT, MD and chaplain at bedside as pt passed. Time of death 0919

## 2018-04-16 NOTE — Code Documentation (Signed)
Pt now in a sinus tachycardia rate 108. MD made aware

## 2018-04-16 NOTE — Code Documentation (Signed)
Pt placed on ED zoll and paced

## 2018-04-16 NOTE — ED Triage Notes (Signed)
Pt from [employer], pt was seen 10 min prior to cardiac arrest. Fire arrived and started CPR. Approx downtime 10-15 min. Pt initial rhythm V Fib. Pt wsas shocked total of 6 times. Pt's rhythm was V Fib entire time except one rhythm check revealed torsades. Last shock, pt regained pulses. Pt received 3 rounds of epi, 300mg  amiodarone. EMS started an Epi drip. As drip ran out, pt became brady HR 40's. EMS began pacing. Pt intubated by EMS.  CBG 147, Systolic BP 98.

## 2018-04-16 DEATH — deceased

## 2020-06-13 IMAGING — DX PORTABLE CHEST - 1 VIEW
1 series · 1 of 1 positions shown · non-contrast
Comparison: July 05, 2017

CLINICAL DATA: Hypoxia/status post cardiac arrest

EXAM:
PORTABLE CHEST 1 VIEW

[chest ap]
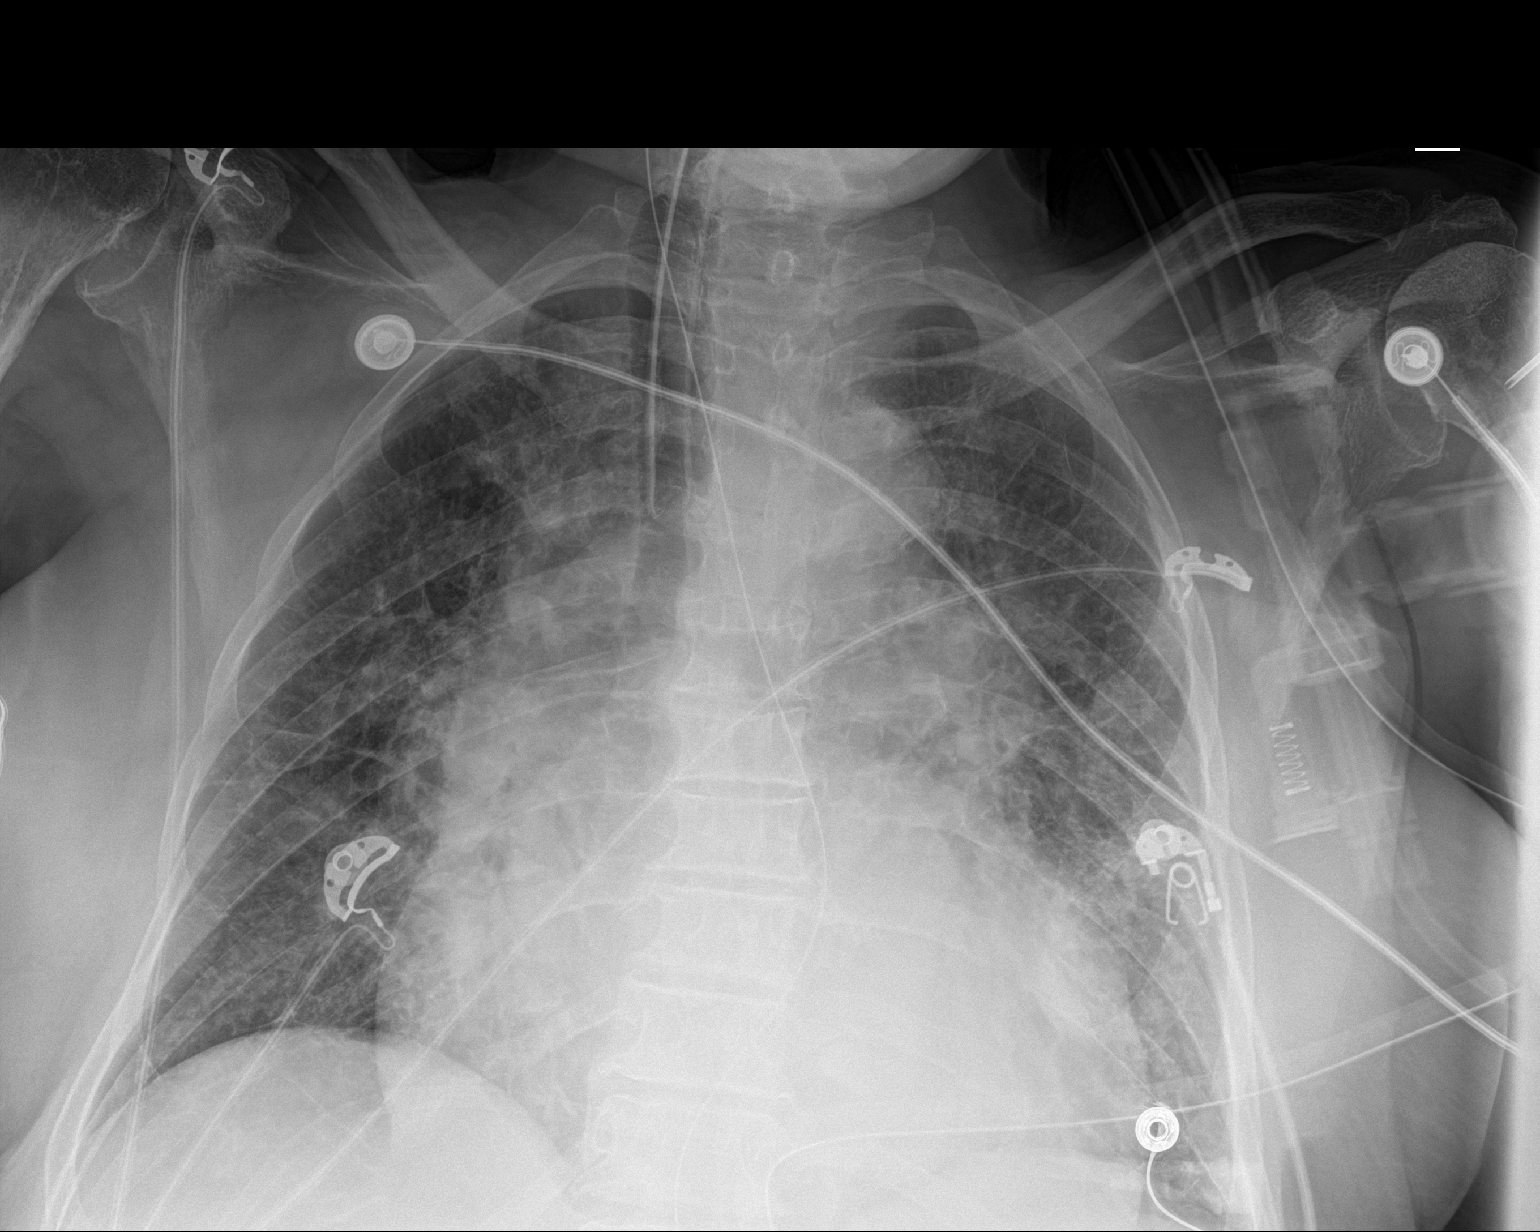

[1 of 1 positions shown; findings below may reference images not displayed]

FINDINGS: Endotracheal tube tip is 2.7 cm above the carina. Nasogastric tube
tip and side port are below the diaphragm. No pneumothorax. There is
cardiomegaly with pulmonary venous hypertension. There is
interstitial edema throughout the lungs bilaterally. There is
atelectasis in the bases. No adenopathy. There is aortic
atherosclerosis. No bone lesions.
IMPRESSION: Tube positions as described without pneumothorax. Cardiomegaly with
pulmonary vascular congestion. Interstitial edema. There is felt to
be a degree of underlying congestive heart failure. Bibasilar
atelectasis. No frank consolidation.
